# Patient Record
Sex: Male | Born: 1985
Health system: Southern US, Community
[De-identification: ages and names within clinical notes are randomized; demographics above are authoritative.]

## PROBLEM LIST (undated history)

## (undated) DIAGNOSIS — Z789 Other specified health status: Secondary | ICD-10-CM

## (undated) HISTORY — DX: Other specified health status: Z78.9

## (undated) HISTORY — PX: OTHER SURGICAL HISTORY: SHX169

---

## 2012-05-02 ENCOUNTER — Emergency Department (HOSPITAL_COMMUNITY)
Admission: EM | Admit: 2012-05-02 | Discharge: 2012-05-02 | Disposition: A | Discharge status: Home / Self Care | Payer: BC Managed Care – PPO | Attending: Emergency Medicine | Admitting: Emergency Medicine

## 2012-05-02 ENCOUNTER — Encounter (HOSPITAL_COMMUNITY): Payer: Self-pay | Admitting: *Deleted

## 2012-05-02 DIAGNOSIS — L989 Disorder of the skin and subcutaneous tissue, unspecified: Secondary | ICD-10-CM | POA: Insufficient documentation

## 2012-05-02 NOTE — ED Provider Notes (Signed)
History     CSN: [id number]  Arrival date & time 05/02/12  2122   First MD Initiated Contact with Patient 05/02/12 2224      Chief Complaint  Patient presents with  . SEXUALLY TRANSMITTED DISEASE    (Consider location/radiation/quality/duration/timing/severity/associated sxs/prior treatment) HPI Comments: Patient presents with complaints of two localized red areas on his penis. He has not had any dysuria, urethral discharge, or pain. He first noticed the spots today. He states that he was last tested for STDs 8 months ago and has been in a monogamous relationship since that time. Last sexual intercourse was 2 days ago. He do not suspect that his partner has an STD. No treatments prior to arrival. Onset acute. Course is constant. Nothing makes the symptoms better or worse.  The history is provided by the patient.    History reviewed. No pertinent past medical history.  History reviewed. No pertinent past surgical history.  History reviewed. No pertinent family history.  History  Substance Use Topics  . Smoking status: Not on file  . Smokeless tobacco: Not on file  . Alcohol Use: Yes      Review of Systems  Constitutional: Negative for fever.  HENT: Negative for sore throat.   Eyes: Negative for discharge.  Gastrointestinal: Negative for rectal pain.  Genitourinary: Positive for genital sores. Negative for dysuria, frequency, discharge, penile pain and testicular pain.  Musculoskeletal: Negative for arthralgias.  Skin: Negative for rash.  Hematological: Negative for adenopathy.    Allergies  Ceclor  Home Medications   Current Outpatient Rx  Name  Route  Sig  Dispense  Refill  . ADULT MULTIVITAMIN W/MINERALS CH   Oral   Take 1 tablet by mouth daily.           BP 148/83  Pulse 61  Temp 98.2 F (36.8 C) (Oral)  Resp 18  Ht 5\' 9"  (1.753 m)  Wt 150 lb (68.04 kg)  BMI 22.15 kg/m2  SpO2 100%  Physical Exam  Nursing note and vitals  reviewed. Constitutional: He appears well-developed and well-nourished.  HENT:  Head: Normocephalic and atraumatic.  Eyes: Conjunctivae normal are normal.  Neck: Normal range of motion. Neck supple.  Pulmonary/Chest: No respiratory distress.  Genitourinary: Right testis shows no mass and no tenderness. Left testis shows no mass and no tenderness. No penile tenderness. No discharge found.       No discharge. Two small 2-27mm macular lesions on penile shaft. Appearance non-specific. No drainage or crusting.   Lymphadenopathy:       Right: No inguinal adenopathy present.       Left: No inguinal adenopathy present.  Neurological: He is alert.  Skin: Skin is warm and dry.  Psychiatric: He has a normal mood and affect.    ED Course  Procedures (including critical care time)  Labs Reviewed - No data to display No results found.   1. Skin lesion    10:59 PM Patient seen and examined. Patient reassured. He defers testing for GC/chlamydia. Counseled on watchful waiting, to seek follow-up with worsening pain, redness, drainage, or other symptoms.  Vital signs reviewed and are as follows: Filed Vitals:   05/02/12 2204  BP: 148/83  Pulse: 61  Temp: 98.2 F (36.8 C)  Resp: 18       MDM  Two small nondescript macules on penile shaft. Do not appear herpetic and do not appear as syphilis. No penile d/c. Patient is reliable and is low risk for STD. Watchful watching for  resolution is appropriate.          [place], [place] 05/04/12 [phone]

## 2012-05-02 NOTE — ED Notes (Signed)
Pt reports rash to penis and would like to be checked for STD's for "peace of mind" denies discharge, reports some irritation.

## 2012-05-04 NOTE — ED Provider Notes (Signed)
Medical screening examination/treatment/procedure(s) were performed by non-physician practitioner and as supervising physician I was immediately available for consultation/collaboration.  Ostin Mathey, MD 05/04/12 1409 

## 2014-08-08 ENCOUNTER — Encounter (HOSPITAL_COMMUNITY): Payer: Self-pay

## 2014-08-08 ENCOUNTER — Emergency Department (HOSPITAL_COMMUNITY)
Admission: EM | Admit: 2014-08-08 | Discharge: 2014-08-09 | Disposition: A | Discharge status: Home / Self Care | Payer: BLUE CROSS/BLUE SHIELD | Attending: Emergency Medicine | Admitting: Emergency Medicine

## 2014-08-08 DIAGNOSIS — Y9389 Activity, other specified: Secondary | ICD-10-CM | POA: Diagnosis not present

## 2014-08-08 DIAGNOSIS — S40862A Insect bite (nonvenomous) of left upper arm, initial encounter: Secondary | ICD-10-CM | POA: Insufficient documentation

## 2014-08-08 DIAGNOSIS — W57XXXA Bitten or stung by nonvenomous insect and other nonvenomous arthropods, initial encounter: Secondary | ICD-10-CM | POA: Insufficient documentation

## 2014-08-08 DIAGNOSIS — Z79899 Other long term (current) drug therapy: Secondary | ICD-10-CM | POA: Insufficient documentation

## 2014-08-08 DIAGNOSIS — R05 Cough: Secondary | ICD-10-CM | POA: Insufficient documentation

## 2014-08-08 DIAGNOSIS — Y998 Other external cause status: Secondary | ICD-10-CM | POA: Diagnosis not present

## 2014-08-08 DIAGNOSIS — S4992XA Unspecified injury of left shoulder and upper arm, initial encounter: Secondary | ICD-10-CM | POA: Diagnosis present

## 2014-08-08 DIAGNOSIS — R091 Pleurisy: Secondary | ICD-10-CM

## 2014-08-08 DIAGNOSIS — Y9259 Other trade areas as the place of occurrence of the external cause: Secondary | ICD-10-CM | POA: Insufficient documentation

## 2014-08-08 NOTE — ED Notes (Signed)
Pt had a sore throat last week, viral, he was just in a hotel this week, about 4pm tonight he noticed red bumps on his left arm and states that his arm is achy

## 2014-08-08 NOTE — ED Provider Notes (Signed)
CSN: [id number]     Arrival date & time 08/08/14  2253 History  This chart was scribed for non-physician practitioner, Emilia BeckKaitlyn Sherle Mello, PA-C, working with Azalia BilisKevin Campos, MD, by Ronney LionSuzanne Le, ED Scribe. This patient was seen in room WTR6/WTR6 and the patient's care was started at 11:57 PM.     Chief Complaint  Patient presents with  . Arm Pain   The history is provided by the patient. No language interpreter was used.    HPI Comments: Jeffery Le is a 29 y.o. male who is right hand dominant who presents to the Emergency Department complaining of dull, aching, intermittently shooting, sharp left arm pain that began yesterday.  Today, he noticed red marks on his right arm. Patient states he had a sore throat last week, and states he still has an intermittent cough likely from the infection, though it feels mostly resolved, as well as associated chest pain. He called a nurse's line and described his symptoms, and was told to come to the ED. Deep inspiration exacerbates the arm pain. He was [place] in [place] in [place], but states he didn't notice any insects. He hasn't taken any medications for this. He denies leg swelling or itching. He also denies any recent heavy lifting.   History reviewed. No pertinent past medical history. History reviewed. No pertinent past surgical history. History reviewed. No pertinent family history. History  Substance Use Topics  . Smoking status: Never Smoker   . Smokeless tobacco: Not on file  . Alcohol Use: Yes    Review of Systems  Respiratory: Positive for cough.   Cardiovascular: Positive for chest pain (secondary to cough). Negative for leg swelling.  Musculoskeletal: Positive for myalgias (arm pain).  Skin: Positive for rash (red bumps on left arm).  All other systems reviewed and are negative.   Allergies  Ceclor  Home Medications   Prior to Admission medications   Medication Sig Start Date End Date Taking? Authorizing Provider   Multiple Vitamin (MULTIVITAMIN WITH MINERALS) TABS Take 1 tablet by mouth daily.    Historical Provider, MD   BP 142/85 mmHg  Pulse 70  Temp(Src) 97.7 F (36.5 C) (Oral)  Resp 18  Ht 5\' 9"  (1.753 m)  Wt 155 lb (70.308 kg)  BMI 22.88 kg/m2  SpO2 98% Physical Exam  Constitutional: He appears well-developed and well-nourished.  HENT:  Head: Normocephalic and atraumatic.  Eyes: Conjunctivae are normal. Pupils are equal, round, and reactive to light.  Neck: Neck supple. No tracheal deviation present. No thyromegaly present.  Cardiovascular: Normal rate and regular rhythm.   No murmur heard. Pulmonary/Chest: Effort normal and breath sounds normal.  Lungs are clear to auscultation.   Abdominal: Soft. Bowel sounds are normal. He exhibits no distension. There is no tenderness.  Musculoskeletal: Normal range of motion. He exhibits no edema or tenderness.  Neurological: He is alert. Coordination normal.  Skin: Skin is warm and dry. No rash noted.  Scattered erythematous papules on left arm and in finger webs.   Psychiatric: He has a normal mood and affect.  Nursing note and vitals reviewed.   ED Course  Procedures (including critical care time)  DIAGNOSTIC STUDIES: Oxygen Saturation is 98% on room air, normal by my interpretation.    COORDINATION OF CARE: 12:00 AM - Discussed treatment plan with pt at bedside which includes EKG, and pt agreed to plan. He declines any Tylenol at this time.   Labs Review Labs Reviewed - No data to display  Imaging Review No results found.  <ECG> .ecg   EKG Interpretation None      MDM   Final diagnoses:  None   1:19 AM Patient's EKG unremarkable for acute changes. Patient likely has pleurisy from previous viral illness. Patient will be treated for bed bugs with permethrin.   I personally performed the services described in this documentation, which was scribed in my presence. The recorded information has been reviewed and is  accurate.     Emilia Beck, PA-C 08/09/14 0121  Azalia Bilis, MD 08/09/14 [phone]

## 2014-08-09 MED ORDER — PERMETHRIN 5 % EX CREA: TOPICAL_CREAM | CUTANEOUS | Status: DC

## 2014-08-09 NOTE — ED Notes (Signed)
EKG GIVEN TO EDP CAMPOS FOR REVIEW

## 2014-08-09 NOTE — Discharge Instructions (Signed)
Use permethrin cream as directed. Refer to attached documents for more information.  °

## 2014-08-09 NOTE — ED Notes (Signed)
Pt reports L arm pain and rash since Friday.  Reports recently having a sore throat and body aches.

## 2014-08-11 ENCOUNTER — Encounter (HOSPITAL_COMMUNITY): Payer: Self-pay | Admitting: Emergency Medicine

## 2014-08-11 ENCOUNTER — Emergency Department (HOSPITAL_COMMUNITY)
Admission: EM | Admit: 2014-08-11 | Discharge: 2014-08-11 | Disposition: A | Discharge status: Home / Self Care | Payer: BLUE CROSS/BLUE SHIELD | Attending: Emergency Medicine | Admitting: Emergency Medicine

## 2014-08-11 DIAGNOSIS — R21 Rash and other nonspecific skin eruption: Secondary | ICD-10-CM | POA: Diagnosis present

## 2014-08-11 DIAGNOSIS — B029 Zoster without complications: Secondary | ICD-10-CM | POA: Insufficient documentation

## 2014-08-11 MED ORDER — ACYCLOVIR 200 MG PO CAPS
800.0000 mg | ORAL_CAPSULE | Freq: Once | ORAL | Status: AC
  Administered 2014-08-11: 800 mg via ORAL
  Filled 2014-08-11: qty 4

## 2014-08-11 MED ORDER — ACYCLOVIR 200 MG PO CAPS
800.0000 mg | ORAL_CAPSULE | Freq: Every day | ORAL | Status: AC
Start: 2014-08-11 — End: 2014-08-18

## 2014-08-11 NOTE — Discharge Instructions (Signed)
Shingles Shingles is caused by the same virus that causes chickenpox. The first feelings may be pain or tingling. A rash will follow in a couple days. The rash may occur on any area of the body. Long-lasting pain is more likely in an elderly person. It can last months to years. There are medicines that can help prevent pain if you start taking them early. HOME CARE   Take cool baths or place cool cloths on the rash as told by your doctor.  Take medicine only as told by your doctor.  Rest as told by your doctor.  Keep your rash clean with mild soap and cool water or as told by your doctor.  Do not scratch your rash. You may use calamine lotion to relieve itchy skin as told by your doctor.  Keep your rash covered with a loose bandage (dressing).  Avoid touching:  Babies.  Pregnant women.  Children with inflamed skin (eczema).  People who have gotten organ transplants.  People with chronic illnesses, such as leukemia or AIDS.  Wear loose-fitting clothing.  If the rash is on the face, you may need to see a specialist. Keep all appointments. Shingles must be kept away from the eyes, if possible.  Keep all follow-up visits as told by your doctor. GET HELP RIGHT AWAY IF:   You have any pain on the face or eye.  You lose feeling on one side of your face.  You have ear pain or ringing in your ear.  You cannot taste as well.  Your medicines do not help the pain.  Your redness or puffiness (swelling) spreads.  You feel like you are getting worse.  You have a fever. MAKE SURE YOU:   Understand these instructions.  Will watch your condition.  Will get help right away if you are not doing well or get worse. Document Released: 10/27/2007 Document Revised: 09/24/2013 Document Reviewed: 10/27/2007 [employer] [employer] Patient Information 2015 [employer] [place]. This information is not intended to replace advice given to you by your health care provider. Make sure you discuss any questions  you have with your health care provider. Make sure to take the acyclovir as directed until all tablets have been completed

## 2014-08-11 NOTE — ED Notes (Signed)
Awaiting arrival of med from main pharmacy

## 2014-08-11 NOTE — ED Notes (Signed)
Pt states he was seen here on Thursday for rash they thought was bedbugs and was given a cream  Pt states the rash continues and wants to be rechecked

## 2014-08-11 NOTE — ED Provider Notes (Signed)
CSN: [id number]     Arrival date & time 08/11/14  1941 History   First MD Initiated Contact with Patient 08/11/14 2000     This chart was scribed for non-physician practitioner, Earley FavorGail King Pinzon, FNP working with Cathren LaineKevin Steinl, MD by Arlan OrganAshley Leger, ED Scribe. This patient was seen in room WTR5/WTR5 and the patient's care was started at 8:35 PM.   Chief Complaint  Patient presents with  . Rash   The history is provided by the patient. No language interpreter was used.    HPI Comments: Jeffery Le is a 29 y.o. male without any pertinent past medical history who presents to the Emergency Department complaining of a constant, painful, ongoing, rash to the L arm x 3 days. Discomfort to rash is described as burning. He also reports "burning" sensation to back of L shoulder without any noticeable rash at this time. 1.5 weeks ago pt reports sore throat and body aches. He was seen at Urgent Care and was discharged home without any medications. Pt states symptoms resolved and rash to L arm appeared shortly after. [name] was seen 08/08/14 for same as he suspected bedbug exposure. At time of visit, pt was discharged with Permethrin. Pt with known allergy to Ceclor.  History reviewed. No pertinent past medical history. History reviewed. No pertinent past surgical history. Family History  Problem Relation Age of Onset  . Diabetes Other    History  Substance Use Topics  . Smoking status: Never Smoker   . Smokeless tobacco: Not on file  . Alcohol Use: Yes    Review of Systems  Constitutional: Negative for fever and chills.  Skin: Positive for rash.      Allergies  Ceclor  Home Medications   Prior to Admission medications   Medication Sig Start Date End Date Taking? Authorizing Provider  Multiple Vitamin (MULTIVITAMIN WITH MINERALS) TABS Take 1 tablet by mouth daily.    Historical Provider, MD  permethrin (ELIMITE) 5 % cream Apply to affected area once 08/09/14   Emilia BeckKaitlyn Szekalski, PA-C    Triage Vitals: BP 148/81 mmHg  Pulse 76  Temp(Src) 98.8 F (37.1 C) (Oral)  Resp 16  SpO2 98%   Physical Exam  Constitutional: He is oriented to person, place, and time. He appears well-developed and well-nourished.  HENT:  Head: Normocephalic.  Eyes: EOM are normal.  Neck: Normal range of motion.  Pulmonary/Chest: Effort normal.  Musculoskeletal: Normal range of motion.  Neurological: He is alert and oriented to person, place, and time.  Skin: Rash noted.  Patient has a vesicular rash on his left arm including forearm inner upper arm and axilla  Psychiatric: He has a normal mood and affect.  Nursing note and vitals reviewed.   ED Course  Procedures (including critical care time)  DIAGNOSTIC STUDIES: Oxygen Saturation is 98% on RA, Normal by my interpretation.    COORDINATION OF CARE: 8:39 PM-Discussed treatment plan with pt at bedside and pt agreed to plan.     Labs Review Labs Reviewed - No data to display  Imaging Review No results found.   EKG Interpretation None     patient will be started on acyclovir 800 mg 5 times a day for 1 week.  He's been given information on shingles given precautions as far as contact  MDM   Final diagnoses:  None    I personally performed the services described in this documentation, which was scribed in my presence. The recorded information has been reviewed and is accurate.  Earley Favor, NP 08/11/14 2046  Earley Favor, NP 08/11/14 1308  Cathren Laine, MD 08/13/14 [phone]

## 2016-01-22 DIAGNOSIS — D2272 Melanocytic nevi of left lower limb, including hip: Secondary | ICD-10-CM | POA: Diagnosis not present

## 2016-04-07 DIAGNOSIS — Z0000 Encounter for general adult medical examination without abnormal findings: Secondary | ICD-10-CM | POA: Diagnosis not present

## 2016-04-12 DIAGNOSIS — Z0000 Encounter for general adult medical examination without abnormal findings: Secondary | ICD-10-CM | POA: Diagnosis not present

## 2016-07-13 DIAGNOSIS — H02826 Cysts of left eye, unspecified eyelid: Secondary | ICD-10-CM | POA: Diagnosis not present

## 2016-12-28 ENCOUNTER — Ambulatory Visit (INDEPENDENT_AMBULATORY_CARE_PROVIDER_SITE_OTHER): Payer: BLUE CROSS/BLUE SHIELD | Admitting: Sports Medicine

## 2016-12-28 ENCOUNTER — Encounter: Payer: Self-pay | Admitting: Sports Medicine

## 2016-12-28 VITALS — BP 116/70 | HR 60 | Ht 69.0 in | Wt 152.2 lb

## 2016-12-28 DIAGNOSIS — M2140 Flat foot [pes planus] (acquired), unspecified foot: Secondary | ICD-10-CM | POA: Diagnosis not present

## 2016-12-28 DIAGNOSIS — M25561 Pain in right knee: Secondary | ICD-10-CM | POA: Diagnosis not present

## 2016-12-28 DIAGNOSIS — R269 Unspecified abnormalities of gait and mobility: Secondary | ICD-10-CM | POA: Diagnosis not present

## 2016-12-28 DIAGNOSIS — M76829 Posterior tibial tendinitis, unspecified leg: Secondary | ICD-10-CM

## 2016-12-28 NOTE — Patient Instructions (Addendum)
Schedule an appointment for Orthotics.   Also check out the YouTube Video from Dr. Myles LippsEric Goodman.  I would like to see you try performing this 5-6 days per week.    A good intro video is: "Independence from Pain 7-minute Video" - [email or web address]/[email or web address]   His more advanced video is: "Powerful Posture and Pain Relief: 12 minutes of Foundation Training" - https://youtu.be/[email or web address]   Do not try to attempt this entire video when first beginning.    Try breaking of each exercise that he goes into shorter segments.  Otherwise if they perform an exercise for 45 seconds, start with 15 seconds and rest and then resume with a begin the new activity.  Work your way up to doing this 12 minute video and I expect to see significant improvements in your pain.

## 2016-12-28 NOTE — Progress Notes (Signed)
OFFICE VISIT NOTE Jeffery FellsMichael D. Delorise Shinerigby, DO     [facility] Phys[facility]  [facility] [place] [phone]  Jeffery Le - 31 y.o. male MRN [id number]  Date of birth: [birth year]  Visit Date: 12/28/2016  PCP: Jeffery DarkParker, Caleb M, MD   Referred by: No ref. provider found  Jeffery Le, CMA acting as scribe for Dr. Berline Choughigby.  SUBJECTIVE:   Chief Complaint  Patient presents with  . Follow-up    right knee pain   HPI: As below and per problem based documentation when appropriate.  Jeffery Le presents today as a new patient with complaint of right knee pain.  Knee pain started about 2 years ago. He was seen in the past and had xray and MRI of the knee. [facility]. They told him that they couldn't find anything wrong after reviewing the imaging. He was given steroid injection and PT was recommended but he did not proceed with it. He was moving into a new apartment about 2 years ago and does recall feeling a sharp pain after twisting his knee wrong. He has also played soccer, lacrosse, and basketball throughout high school and college. He still does weight lifting and running/cardio. No recent injury to the knee.  Pain is worse when running, changing direction, going down stairs or down a hill. He also has increased pain when sitting for prolonged periods of time. Pain is medial and infrapatellar. He has noticed some swelling in the knee. He reports little relief with steroid injection. He will apply ice to the knee occasionally and he has taken Advil in the past for the pain. He got some short term relief with these therapies. Pain is described as throbbing with occasional sharp pain when running. Sharp pain is rated about 5/10 and throbbing pain is rated about 1/10.  Pt denies radiation of pain into upper or lower leg, no ankle pain or hip pain.     Review of Systems  Constitutional: Negative for chills  and fever.  Respiratory: Negative for shortness of breath and wheezing.   Cardiovascular: Negative for chest pain, palpitations and leg swelling.  Musculoskeletal: Positive for joint pain.  Neurological: Negative for dizziness, tingling and headaches.  Endo/Heme/Allergies: Does not bruise/bleed easily.    Otherwise per HPI.  HISTORY & PERTINENT PRIOR DATA:  No specialty comments available. He reports that he quit smoking about 10 years ago. His smokeless tobacco use includes Chew. No results for input(s): HGBA1C, LABURIC in the last 8760 hours. Medications & Allergies reviewed per EMR Patient Active Problem List   Diagnosis Date Noted  . Right knee pain 12/28/2016  . Gait disturbance 12/28/2016   No past medical history on file. Family History  Problem Relation Age of Onset  . Diabetes Other        Paternal side  . Alzheimer's disease Other        Maternal side  . Hyperlipidemia Father    Past Surgical History:  Procedure Laterality Date  . ear tues Bilateral    Social History   Occupational History  . pilot    Social History Main Topics  . Smoking status: Former Smoker    Quit date: 2008  . Smokeless tobacco: Current User    Types: Chew  . Alcohol use Yes  . Drug use: No  . Sexual activity: Yes    Partners: Female    OBJECTIVE:  VS:  HT:5\' 9"  (  175.3 cm)   WT:152 lb 3.2 oz (69 kg)  BMI:22.5    BP:116/70  HR:60bpm  TEMP: ( )  RESP:97 % EXAM: Findings:  WDWN, NAD, Non-toxic appearing Alert & appropriately interactive Not depressed or anxious appearing No increased work of breathing. Pupils are equal. EOM intact without nystagmus No clubbing or cyanosis of the extremities appreciated No significant rashes/lesions/ulcerations overlying the examined area. DP & PT pulses 2+/4.  No significant pretibial edema.  No clubbing or cyanosis Sensation intact to light touch in lower extremities.  Right Knee: Overall joint is well aligned, no significant deformity.     No significant effusion.   ROM: 0 to 120.   Extensor mechanism intact No significant medial or lateral joint line tenderness.   Stable to varus/valgus strain & anterior/posterior drawer.  Normal Lachman's.   Negative McMurray's and Thessaly structural exam:  Structural exam: Right externally rotated lower leg.  He has posterior tibialis insufficiency type I on the right, normal on the left with slight longitudinal arch collapse.  He has a externally rotated right foot with ambulation.  Right anterior innominate with significantly tight hip flexor on the right and tight hamstrings.  He has good internal/external rotation of the hip.  There is slight medial column collapse on the right compared to the left.  The transverse arch has splay toe between toes 1 and 2 bilaterally; worse on the right than the left     No results found. ASSESSMENT & PLAN:     ICD-10-CM   1. Right knee pain, unspecified chronicity M25.561   2. Posterior tibialis tendon insufficiency M21.40   3. 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  Scientist, research (medical)" exercises provided today.  We also discussed the importance of avoiding deep knee squatting.  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  We will plan to have him come back to have these fabricated and have him begin on therapeutic exercises in the interim.   =================================================================  Follow-up: Return in about 1 week (around 01/04/2017) for for Orthotics. Okay to schedule same day as PCP appointment.   CMA/ATC served as Neurosurgeon during this visit. History, Physical, and Plan performed by medical  provider. Documentation and orders reviewed and attested to.      Gaspar Bidding, DO    [facility] Phys[facility]

## 2017-01-03 ENCOUNTER — Ambulatory Visit (INDEPENDENT_AMBULATORY_CARE_PROVIDER_SITE_OTHER): Payer: BLUE CROSS/BLUE SHIELD | Admitting: Sports Medicine

## 2017-01-03 ENCOUNTER — Ambulatory Visit (INDEPENDENT_AMBULATORY_CARE_PROVIDER_SITE_OTHER): Payer: BLUE CROSS/BLUE SHIELD | Admitting: Family Medicine

## 2017-01-03 ENCOUNTER — Encounter: Payer: Self-pay | Admitting: Family Medicine

## 2017-01-03 ENCOUNTER — Encounter: Payer: Self-pay | Admitting: Sports Medicine

## 2017-01-03 VITALS — BP 100/80 | HR 66 | Ht 69.0 in | Wt 150.6 lb

## 2017-01-03 DIAGNOSIS — Z8342 Family history of familial hypercholesterolemia: Secondary | ICD-10-CM | POA: Diagnosis not present

## 2017-01-03 DIAGNOSIS — R269 Unspecified abnormalities of gait and mobility: Secondary | ICD-10-CM

## 2017-01-03 DIAGNOSIS — G8929 Other chronic pain: Secondary | ICD-10-CM

## 2017-01-03 DIAGNOSIS — M25561 Pain in right knee: Secondary | ICD-10-CM

## 2017-01-03 DIAGNOSIS — Z0000 Encounter for general adult medical examination without abnormal findings: Secondary | ICD-10-CM | POA: Diagnosis not present

## 2017-01-03 DIAGNOSIS — Z114 Encounter for screening for human immunodeficiency virus [HIV]: Secondary | ICD-10-CM | POA: Diagnosis not present

## 2017-01-03 LAB — CBC WITH DIFFERENTIAL/PLATELET
Basophils Absolute: 0 10*3/uL (ref 0.0–0.1)
Basophils Relative: 0.7 % (ref 0.0–3.0)
EOS PCT: 2.5 % (ref 0.0–5.0)
Eosinophils Absolute: 0.1 10*3/uL (ref 0.0–0.7)
HEMATOCRIT: 42.3 % (ref 39.0–52.0)
HEMOGLOBIN: 14.4 g/dL (ref 13.0–17.0)
Lymphocytes Relative: 42 % (ref 12.0–46.0)
Lymphs Abs: 2.2 10*3/uL (ref 0.7–4.0)
MCHC: 34 g/dL (ref 30.0–36.0)
MCV: 95.4 fl (ref 78.0–100.0)
MONO ABS: 0.4 10*3/uL (ref 0.1–1.0)
Monocytes Relative: 7.3 % (ref 3.0–12.0)
Neutro Abs: 2.5 10*3/uL (ref 1.4–7.7)
Neutrophils Relative %: 47.5 % (ref 43.0–77.0)
PLATELETS: 262 10*3/uL (ref 150.0–400.0)
RBC: 4.43 Mil/uL (ref 4.22–5.81)
RDW: 12.2 % (ref 11.5–15.5)
WBC: 5.2 10*3/uL (ref 4.0–10.5)

## 2017-01-03 LAB — COMPREHENSIVE METABOLIC PANEL
ALBUMIN: 4.8 g/dL (ref 3.5–5.2)
ALK PHOS: 49 U/L (ref 39–117)
ALT: 18 U/L (ref 0–53)
AST: 25 U/L (ref 0–37)
BILIRUBIN TOTAL: 0.9 mg/dL (ref 0.2–1.2)
BUN: 23 mg/dL (ref 6–23)
CO2: 32 mEq/L (ref 19–32)
Calcium: 9.7 mg/dL (ref 8.4–10.5)
Chloride: 99 mEq/L (ref 96–112)
Creatinine, Ser: 0.95 mg/dL (ref 0.40–1.50)
GFR: 98.4 mL/min (ref >60.00)
Glucose, Bld: 86 mg/dL (ref 70–99)
POTASSIUM: 4.4 meq/L (ref 3.5–5.1)
Sodium: 136 mEq/L (ref 135–145)
TOTAL PROTEIN: 6.7 g/dL (ref 6.0–8.3)

## 2017-01-03 LAB — LIPID PANEL
Cholesterol: 125 mg/dL (ref 0–200)
HDL: 53.6 mg/dL (ref >39.00)
LDL CALC: 58 mg/dL (ref 0–99)
NONHDL: 71.64
TRIGLYCERIDES: 66 mg/dL (ref 0.0–149.0)
Total CHOL/HDL Ratio: 2
VLDL: 13.2 mg/dL (ref 0.0–40.0)

## 2017-01-03 NOTE — Progress Notes (Signed)
Subjective:  Jeffery Le is a 31 y.o. male who presents today for his annual comprehensive physical exam.    HPI:  Lump in Nose It has been there for a little over a year and has gotten smaller over that time. Feels in in his right nostril. It is otherwise asymptomatic and does not bother him. No pain or bleeding. No fevers or chills. No weight loss. No treatments tried. Occasionally gets seasonal allergies but does not take any medications.   Depression screen PHQ 2/9 01/03/2017  Decreased Interest 0  Down, Depressed, Hopeless 0  PHQ - 2 Score 0    Health Maintenance Due  Topic Date Due  . HIV Screening  03/10/2001  . TETANUS/TDAP  03/10/2005  . INFLUENZA VACCINE  12/22/2016    ROS: Per HPI, otherwise all systems reviewed and are negative  PMH:  The following were reviewed and entered/updated in epic: History reviewed. No pertinent past medical history. Patient Active Problem List   Diagnosis Date Noted  . Right knee pain 12/28/2016  . Gait disturbance 12/28/2016   Past Surgical History:  Procedure Laterality Date  . ear tues Bilateral    Family History  Problem Relation Age of Onset  . Diabetes Other        Paternal side  . Alzheimer's disease Other        Maternal side  . Hyperlipidemia Father    Medications- reviewed and updated Current Outpatient Prescriptions  Medication Sig Dispense Refill  . ibuprofen (ADVIL,MOTRIN) 200 MG tablet Take 200 mg by mouth every 6 (six) hours as needed.    . Multiple Vitamin (MULTIVITAMIN WITH MINERALS) TABS Take 1 tablet by mouth daily.     No current facility-administered medications for this visit.     Allergies-reviewed and updated Allergies  Allergen Reactions  . Ceclor [Cefaclor]     Unknown   . Cephalosporins Other (See Comments)    unknown    Social History   Social History  . Marital status: Single    Spouse name: N/A  . Number of children: 0  . Years of education: N/A   Occupational  History  . pilot    Social History Main Topics  . Smoking status: Former Smoker    Quit date: 2008  . Smokeless tobacco: Current User    Types: Chew  . Alcohol use Yes  . Drug use: No  . Sexual activity: Yes    Partners: Female   Other Topics Concern  . None   Social History Narrative  . None   Objective:  Physical Exam: BP 100/80   Pulse 66   Ht 5\' 9"  (1.753 m)   Wt 150 lb 9.6 oz (68.3 kg)   SpO2 98%   BMI 22.24 kg/m   Body mass index is 22.24 kg/m. Gen: NAD, resting comfortably HEENT: Small nasal polyp noted in right nostril. Otherwise normal mucosal tissue.  CV: RRR with no murmurs appreciated Pulm: NWOB, CTAB with no crackles, wheezes, or rhonchi GI: Normal bowel sounds present. Soft, Nontender, Nondistended. MSK: no edema, cyanosis, or clubbing noted Skin: warm, dry Neuro: grossly normal, moves all extremities Psych: Normal affect and thought content  Assessment/Plan:   Nasal Polyp Given that it has decreased in size and has been present for over a year, very low likelihood of it being non-benign. Likely a mucosal polyp given his history of allergic rhinitis. Recommended over the counter intranasal glucocorticoids, however patient deferred. Reassured patient. Encouraged patient to continue watching it and  let us know if it starts to increase in size or become symptomatic.   Healthcare Maintenance Check lipid panel (given family history) and HIV antibody today.   Patient Counseling:  -Nutrition: Stressed importance of moderation in sodium/caffeine intake, saturated fat and cholesterol, caloric balance, sufficient intake of fresh fruits, vegetables, and fiber.  -Stressed the importance of regular exercise.   -Substance Abuse: Discussed cessation/primary prevention of tobacco, alcohol, or other drug use; driving or other dangerous activities under the influence; availability of treatment for abuse.   -Injury prevention: Discussed safety belts, safety helmets,  smoke detector, smoking near bedding or upholstery.   -Sexuality: Discussed sexually transmitted diseases, partner selection, use of condoms, avoidance of unintended pregnancy and contraceptive alternatives.   -Health maintenance and immunizations reviewed. Please refer to Health maintenance section.  Return to care in 1 year for next preventative visit.   Jeffery Degreealeb M. Jimmey RalphParker, MD 01/03/2017 9:32 AM

## 2017-01-03 NOTE — Progress Notes (Signed)
  Jeffery Le - 31 y.o. male MRN [id number]  Date of birth: February 22, 1986  SUBJECTIVE: CC: Office visit for Custom Orthotic Fabrication HPI: Patient presents for custom cushion orthotic fabrication given history as outlined per prior office visit note.  He is an Buyer, retailairline pilot and has been having medial knee pain secondary to excessive pronation with gait.  ROS: Per HPI  HISTORY:  Past Medical, Surgical, Social, and Family History reviewed & updated per EMR.  Pertinent Historical Findings include:  reports that he quit smoking about 10 years ago. His smokeless tobacco use includes Chew.  OBJECTIVE:  VS:   HT:5\' 9"  (175.3 cm)   WT:150 lb 9.6 oz (68.3 kg)  BMI:22.3          BP:100/80  HR:66bpm  TEMP: ( )  RESP:98 %  PHYSICAL EXAM:  Adult male in no acute distress alert and appropriate. Bilateral lower extremities overall well aligned.  He has excessive pronation while at rest with calcaneal valgus of approximately 15.  ASSESSMENT: 1. Gait disturbance   2. Chronic pain of right knee    PROCEDURES: CUSTOM ORTHOTIC FABRICATION The patient was fitted for a standard, cushioned, semi-rigid orthotic. The orthotic was heated & placed on the orthotic stand. The patient was positioned in subtalar neutral position and 10 of ankle dorsiflexion and weight bearing stance some heated orthotic blank. After completion of the molding a stable paste was applied to the orthotic blank. The orthotic was ground to a stable position for weightbearing. The patient ambulated in these and reported they were comfortable without pressure spots.              BLANK:  Size 11 - Standard Cushioned                 BASE:  Blue EVA      POSTINGS:  n/a  PLAN: See problem based charting & AVS for additional documentation.  Custom Orthotics As above  HEP: Continue as outlined

## 2017-01-03 NOTE — Patient Instructions (Signed)
We will check blood work.  Continue being active and eating a healthy diet.  Come back in 1 year,  Take care,  Dr Jimmey RalphParker

## 2017-01-04 LAB — HIV ANTIBODY (ROUTINE TESTING W REFLEX): HIV 1&2 Ab, 4th Generation: NONREACTIVE

## 2017-01-07 ENCOUNTER — Telehealth: Payer: Self-pay | Admitting: Family Medicine

## 2017-01-07 NOTE — Telephone Encounter (Signed)
ROI fax to [facility]

## 2017-01-15 NOTE — Assessment & Plan Note (Signed)
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  Scientist, research (medical)" exercises provided today.  We also discussed the importance of avoiding deep knee squatting.  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  We will plan to have him come back to have these fabricated and have him begin on therapeutic exercises in the interim.

## 2017-07-15 DIAGNOSIS — F4322 Adjustment disorder with anxiety: Secondary | ICD-10-CM | POA: Diagnosis not present

## 2017-08-09 DIAGNOSIS — F4322 Adjustment disorder with anxiety: Secondary | ICD-10-CM | POA: Diagnosis not present

## 2017-08-15 ENCOUNTER — Ambulatory Visit (INDEPENDENT_AMBULATORY_CARE_PROVIDER_SITE_OTHER): Payer: BLUE CROSS/BLUE SHIELD | Admitting: Family Medicine

## 2017-08-15 ENCOUNTER — Encounter: Payer: Self-pay | Admitting: Family Medicine

## 2017-08-15 VITALS — BP 116/72 | HR 75 | Temp 97.9°F | Ht 69.0 in | Wt 162.0 lb

## 2017-08-15 DIAGNOSIS — R05 Cough: Secondary | ICD-10-CM | POA: Diagnosis not present

## 2017-08-15 DIAGNOSIS — R058 Other specified cough: Secondary | ICD-10-CM

## 2017-08-15 NOTE — Progress Notes (Signed)
    Subjective:  Jeffery Le is a 32 y.o. male who presents today for same-day appointment with a chief complaint of cough.   HPI:  Cough, Acute Issue Started 2 weeks ago. Initially associated with rhinorrhea, postnasal drip, and ear pressure.  The symptoms have since resolved however he has had a persistent cough.  No shortness of breath.  No fever or chills.  Has not tried any treatments for this.  Patient had similar episode 2 years ago and wanted to make sure there was nothing else going on.  ROS: Per HPI  PMH: He reports that he quit smoking about 11 years ago. His smokeless tobacco use includes chew. He reports that he drinks alcohol. He reports that he does not use drugs.  Objective:  Physical Exam: BP 116/72 (BP Location: Left Arm)   Pulse 75   Temp 97.9 F (36.6 C)   Ht 5\' 9"  (1.753 m)   Wt 162 lb (73.5 kg)   SpO2 (!) 77%   BMI 23.92 kg/m   Gen: NAD, resting comfortably HEENT: TMs clear.  Oropharynx clear.  Nasal mucosa without abnormality. CV: RRR with no murmurs appreciated Pulm: NWOB, CTAB with no crackles, wheezes, or rhonchi  Assessment/Plan:  Cough Likely postviral cough syndrome.  Pulmonary exam today without abnormality.  No red flag signs or symptoms.  Reassured patient and discussed typical length of illness.  Patient deferred further pharmacotherapy at this point.  Return precautions reviewed.  Follow-up as needed.  Katina Degreealeb M. Jimmey RalphParker, MD 08/15/2017 11:00 AM

## 2017-09-23 ENCOUNTER — Encounter: Payer: Self-pay | Admitting: Family Medicine

## 2017-09-23 ENCOUNTER — Ambulatory Visit (INDEPENDENT_AMBULATORY_CARE_PROVIDER_SITE_OTHER): Payer: BLUE CROSS/BLUE SHIELD | Admitting: Family Medicine

## 2017-09-23 VITALS — BP 124/72 | HR 75 | Temp 98.1°F | Ht 69.0 in | Wt 160.8 lb

## 2017-09-23 DIAGNOSIS — M25571 Pain in right ankle and joints of right foot: Secondary | ICD-10-CM

## 2017-09-23 NOTE — Progress Notes (Signed)
    Subjective:  Jeffery Le is a 32 y.o. male who presents today for same-day appointment with a chief complaint of ankle pain.   HPI:  Ankle Pain, New problem  Started 2 weeks ago. Patient injured his right ankle while riding a scooter.  Patient had a curve and subsequently noticed pain to the medial aspect of his right ankle.  He was seen at a local urgent care and had x-rays done which were negative for fracture.  Patient was diagnosed with an ankle sprain and sent home with a prescription for naproxen and a compressive sleeve for his ankle.  Symptoms have improved over the last 1 to 2 weeks.  Still has little bit of swelling to the medial aspect of his right ankle.  Still has a small amount of pain when he pushes down with his foot.No other obvious alleviating or aggravating factors.  ROS: Per HPI  PMH: He reports that he quit smoking about 11 years ago. His smokeless tobacco use includes chew. He reports that he drinks alcohol. He reports that he does not use drugs.  Objective:  Physical Exam: BP 124/72 (BP Location: Left Arm, Patient Position: Sitting, Cuff Size: Normal)   Pulse 75   Temp 98.1 F (36.7 C) (Oral)   Ht  (1.753 m)   Wt 160 lb 12.8 oz (72.9 kg)   SpO2 97%   BMI 23.75 kg/m   Gen: NAD, resting comfortably MSK: -Right ankle: Mild effusion noted to medial malleolus.  Nontender to palpation.  Negative talar tilt.  Posterior and anterior drawer signs negative.  Neurovascular intact distally  Assessment/Plan:  Right ankle pain Seems to be healing normally.  Patient is about 2 weeks status post his injury and has had significant improvement in his symptoms.  Advised patient to continue using anti-inflammatory and compression sleeve for the next 2 to 4 weeks.  Home exercise program was also discussed with patient focusing on strengthening ankle musculature.  Patient has a fair amount of pronation bilaterally which could contribute to his medial ankle pain.  He  has already seen sports medicine for this and has orthotics in place.  Discussed reasons to return to care.  He will follow-up as needed.  [name]. [name], MD 09/23/2017 1:12 PM

## 2017-09-23 NOTE — Patient Instructions (Signed)
It is nice to see you today!  Your ankle is healing normally.  Please continue working on the exercise and using the compression sleeve.  Let me know if your symptoms worsen or do not improve over the next 2-4 weeks.  Take care, Dr [name]

## 2017-10-12 NOTE — Progress Notes (Signed)
[facility]ells. [facility] at [facility] [phone]  Raylyn Speckman Spira - 32 y.o. male MRN [id number]  Date of birth: Jan 06, 1986  Visit Date: 10/13/2017  PCP: Ardith Dark, MD   Referred by: Ardith Dark, MD  Scribe for today's visit: Stevenson Clinch, CMA     SUBJECTIVE:  Jeffery Le is here for No chief complaint on file.  His R ankle pain  symptoms INITIALLY: Began about 5 weeks ago and started after running into curb with scooter. He hit the medial aspect of the R ankle on the concrete.  Described as mild-severe burning that comes and goes, non-radiating. Worsened with plantar flexion and inversion Improved with rest. He denies pain with weight bearing.  Additional associated symptoms include: He c/o continued swelling around the ankle.    At this time symptoms are improving compared to onset. He has been taking Naproxen prn. He tried icing the ankle for a while but he hasn't been for the past week. He has been doing HEP for ankle with little trouble.   He had XR done in [place] at [facility].   He also c/o R knee pain. He has been seen for this in the past. Pain is around the patella. He has noticed some swelling around the knee. The area is tender to palpation.   ROS Denies night time disturbances. Denies fevers, chills, or night sweats. Denies unexplained weight loss. Denies personal history of cancer. Denies changes in bowel or bladder habits. Reports recent unreported falls x 1 (off scooter). Denies new or worsening dyspnea or wheezing. Denies headaches or dizziness.  Denies numbness, tingling or weakness  In the extremities.  Denies dizziness or presyncopal episodes Reports lower extremity edema    HISTORY & PERTINENT PRIOR DATA:  Prior History reviewed and updated per electronic medical record.  Significant/pertinent history, findings, studies include:  reports that he quit smoking  about 11 years ago. His smokeless tobacco use includes chew. No results for input(s): HGBA1C, LABURIC, CREATINE in the last 8760 hours. No specialty comments available. No problems updated.  OBJECTIVE:  VS:  HT:5\' 9"  (175.3 cm)   WT:158 lb 6.4 oz (71.8 kg)  BMI:23.38    BP:120/82  HR:71bpm  TEMP: ( )  RESP:96 %   PHYSICAL EXAM: Constitutional: WDWN, Non-toxic appearing. Psychiatric: Alert & appropriately interactive.  Not depressed or anxious appearing. Respiratory: No increased work of breathing.  Trachea Midline Eyes: Pupils are equal.  EOM intact without nystagmus.  No scleral icterus  Vascular Exam: warm to touch no edema  lower extremity neuro exam: unremarkable normal strength normal sensation  MSK Exam: Ankle has TTP over the syndesmosis most focally.  He has no pain with cotton testing or with talar tilt, Klier testing and good stability with anterior drawer.   ASSESSMENT & PLAN:   1. Right ankle pain, unspecified chronicity   2. High ankle sprain of right lower extremity, initial encounter     PLAN: We discussed that the likelihood of a high ankle sprain continuing to cause some discomfort at this time is normal.  Begin working on therapeutic exercises including four-way ankle.  Body Helix compression sleeve discussed.  Slowly return back to activities over the next 6 weeks if any persistent ongoing symptoms can consider further diagnostic evaluation with MRI but given the normal x-rays previously obtained likely of significant osseous injury is low.  Follow-up: Return in about 6 weeks (around 11/24/2017).  Please see additional documentation for Objective, Assessment and Plan sections. Pertinent additional documentation may be included in corresponding procedure notes, imaging studies, problem based documentation and patient instructions. Please see these sections of the encounter for additional information regarding this visit.  CMA/ATC served as Education administrator  during this visit. History, Physical, and Plan performed by medical provider. Documentation and orders reviewed and attested to.      Gerda Diss, [facility] Physician

## 2017-10-13 ENCOUNTER — Ambulatory Visit (INDEPENDENT_AMBULATORY_CARE_PROVIDER_SITE_OTHER): Payer: BLUE CROSS/BLUE SHIELD | Admitting: Sports Medicine

## 2017-10-13 ENCOUNTER — Encounter: Payer: Self-pay | Admitting: Sports Medicine

## 2017-10-13 VITALS — BP 120/82 | HR 71 | Ht 69.0 in | Wt 158.4 lb

## 2017-10-13 DIAGNOSIS — S93431A Sprain of tibiofibular ligament of right ankle, initial encounter: Secondary | ICD-10-CM | POA: Diagnosis not present

## 2017-10-13 DIAGNOSIS — S93491A Sprain of other ligament of right ankle, initial encounter: Secondary | ICD-10-CM

## 2017-10-13 DIAGNOSIS — M25571 Pain in right ankle and joints of right foot: Secondary | ICD-10-CM | POA: Diagnosis not present

## 2017-10-13 NOTE — Patient Instructions (Signed)
I recommend you obtained a compression sleeve to help with your joint problems. There are many options on the market however I recommend obtaining a small Full Ankle Body Helix compression sleeve.  You can find information (including how to appropriate measure yourself for sizing) can be found at [email or web address].  Many of these products are health savings account (HSA) eligible.   You can use the compression sleeve at any time throughout the day but is most important to use while being active as well as for 2 hours post-activity.   It is appropriate to ice following activity with the compression sleeve in place.

## 2017-10-24 ENCOUNTER — Encounter: Payer: Self-pay | Admitting: Sports Medicine

## 2017-11-17 ENCOUNTER — Encounter: Payer: Self-pay | Admitting: Sports Medicine

## 2017-11-17 ENCOUNTER — Ambulatory Visit (INDEPENDENT_AMBULATORY_CARE_PROVIDER_SITE_OTHER): Payer: BLUE CROSS/BLUE SHIELD | Admitting: Sports Medicine

## 2017-11-17 VITALS — BP 108/66 | HR 78 | Ht 69.0 in | Wt 160.0 lb

## 2017-11-17 DIAGNOSIS — G8929 Other chronic pain: Secondary | ICD-10-CM | POA: Diagnosis not present

## 2017-11-17 DIAGNOSIS — S93491A Sprain of other ligament of right ankle, initial encounter: Secondary | ICD-10-CM

## 2017-11-17 DIAGNOSIS — M25561 Pain in right knee: Secondary | ICD-10-CM

## 2017-11-17 DIAGNOSIS — S93431A Sprain of tibiofibular ligament of right ankle, initial encounter: Secondary | ICD-10-CM | POA: Diagnosis not present

## 2017-11-17 DIAGNOSIS — M25571 Pain in right ankle and joints of right foot: Secondary | ICD-10-CM

## 2017-11-17 NOTE — Progress Notes (Signed)
PROCEDURE NOTE: THERAPEUTIC EXERCISES (97110) 15 minutes spent for Therapeutic exercises as below and as referenced in the AVS.  This included exercises focusing on stretching, strengthening, with significant focus on eccentric aspects.   Proper technique shown and discussed handout in great detail with ATC.  All questions were discussed and answered.   Long term goals include an improvement in range of motion, strength, endurance as well as avoiding reinjury. Frequency of visits is one time as determined during today's  office visit. Frequency of exercises to be performed is as per handout.  EXERCISES REVIEWED:  Hip ABduction strengthening with focus on Glute Medius Recruitment  VMO Strengthening  ITB stretching

## 2017-11-17 NOTE — Progress Notes (Signed)
[facility]ells. Del[facility] at [facility] [phone]  Reuel Lamadrid Sigala - 32 y.o. male MRN [id number]  Date of birth: 10-25-1985  Visit Date: 11/17/2017  PCP: Ardith Dark, MD   Referred by: Ardith Dark, MD  Scribe(s) for today's visit: Christoper Fabian, LAT, ATC  SUBJECTIVE:  Dovid Bartko Gallina is here for Follow-up (R high ankle sprain) .    His R ankle pain  symptoms INITIALLY: Began about 5 weeks ago and started after running into curb with scooter. He hit the medial aspect of the R ankle on the concrete.  Described as mild-severe burning that comes and goes, non-radiating. Worsened with plantar flexion and inversion Improved with rest. He denies pain with weight bearing.  Additional associated symptoms include: He c/o continued swelling around the ankle.    At this time symptoms are improving compared to onset. He has been taking Naproxen prn. He tried icing the ankle for a while but he hasn't been for the past week. He has been doing HEP for ankle with little trouble.   He had XR done in [place] at [facility].   He also c/o R knee pain. He has been seen for this in the past. Pain is around the patella. He has noticed some swelling around the knee. The area is tender to palpation.   11/17/17: Compared to the last office visit on 10/13/17, his previously described R ankle pain symptoms are improving. Current symptoms are mild-moderate & are nonradiating He has been taking Naproxen and using a Body Helix compression sleeve.   REVIEW OF SYSTEMS: Denies night time disturbances. Denies fevers, chills, or night sweats. Denies unexplained weight loss. Denies personal history of cancer. Denies changes in bowel or bladder habits. Denies recent unreported falls. Denies new or worsening dyspnea or wheezing. Denies headaches or dizziness.  Denies numbness, tingling or weakness  In the extremities.  Denies  dizziness or presyncopal episodes Reports lower extremity edema -right ankle, not new   HISTORY & PERTINENT PRIOR DATA:  Significant/pertinent history, findings, studies include:  reports that he quit smoking about 11 years ago. His smokeless tobacco use includes chew. No results for input(s): HGBA1C, LABURIC, CREATINE in the last 8760 hours. No specialty comments available. No problems updated.  Otherwise prior history reviewed and updated per electronic medical record.    OBJECTIVE:  VS:  HT:5\' 9"  (175.3 cm)   WT:160 lb (72.6 kg)  BMI:23.62    BP:108/66  HR:78bpm  TEMP: ( )  RESP:96 %   PHYSICAL EXAM: CONSTITUTIONAL: Well-developed, Well-nourished and In no acute distress Alert & appropriately interactive. and Not depressed or anxious appearing. RESPIRATORY: No increased work of breathing and Trachea Midline EYES: Pupils are equal., EOM intact without nystagmus. and No scleral icterus.  Lower extremities: Warm and well perfused Pulses: DP Pulses: Bilaterally normal and symmetric PT Pulses: Bilaterally normal and symmetric Edema: Joint associated swelling as per Orthopedic exam and No Pre-tibial edema NEURO: unremarkable Normal associated myotomal distribution strength to manual muscle testing Normal sensation to light touch  MSK Exam: Left ankle: . Well aligned, no significant deformity. . No overlying skin changes. . TTP over Anterior lateral and anterior joint line directly over the syndesmosis.  Mild pain throughout. . Range of motion is overall well-maintained with slight loss of inversion actively and slight loss of dorsiflexion and plantar flexion by 10 degrees approximately with slight pain at the end range but this is minimal.  This was not push today for stability purposes. . Ligamentously stable to Anterior drawer, talar tilting and clear testing.  Normal cotton test. .   Knee: Overall well aligned.  He has slight weakness with hip abduction and VMO weakness  with a hip flexor tightness that is mild.  PROCEDURES & DATA REVIEWED:  . Osteopathic manipulation was performed today based on physical exam findings.  Please see procedure note for further information including Osteopathic Exam findings . Discussed the foundation of treatment for this condition is physical therapy and/or daily (5-6 days/week) therapeutic exercises, focusing on core strengthening, coordination, neuromuscular control/reeducation.  Therapeutic exercises prescribed per procedure note.  ASSESSMENT  No diagnosis found.  PLAN:       . Begin to increase his activities slowly over the next several weeks. . Continue with ankle bracing, ice, rest as needed. Marland Kitchen. He will continue to improve although slowly with a high ankle sprain. Marland Kitchen. Please see procedure section and notes. No problem-specific Assessment & Plan notes found for this encounter.  Follow-up: Return in about 6 weeks (around 12/29/2017).      Please see additional documentation for Objective, Assessment and Plan sections. Pertinent additional documentation may be included in corresponding procedure notes, imaging studies, problem based documentation and patient instructions. Please see these sections of the encounter for additional information regarding this visit.  CMA/ATC served as Neurosurgeonscribe during this visit. History, Physical, and Plan performed by medical provider. Documentation and orders reviewed and attested to.      Andrena MewsMichael D Rigby, DO    [facility] Physician

## 2017-11-17 NOTE — Patient Instructions (Addendum)
Please perform the exercise program that we have prepared for you and gone over in detail on a daily basis.  In addition to the handout you were provided you can access your program through: [email or web address]   Your unique program code is:  [email or web address]

## 2017-11-17 NOTE — Progress Notes (Signed)
PROCEDURE NOTE : OSTEOPATHIC MANIPULATION The decision today to treat with Osteopathic Manipulative Therapy (OMT) was based on physical exam findings. Verbal consent was obtained following a discussion with the patient regarding the of risks, benefits and potential side effects, including an acute pain flare,post manipulation soreness and need for repeat treatments.     Contraindications to OMT: NONE  Manipulation was performed as below: Regions treated: Lower extremities OMT Techniques Used: HVLA, muscle energy and myofascial release  The patient tolerated the treatment well and reported Improved symptoms following treatment today. Patient was given medications, exercises, stretches and lifestyle modifications per AVS and verbally.   OSTEOPATHIC/STRUCTURAL EXAM:   Plantar flexed first ray Left anterior tibia on talus

## 2017-11-25 ENCOUNTER — Ambulatory Visit: Payer: BLUE CROSS/BLUE SHIELD | Admitting: Sports Medicine

## 2017-12-29 ENCOUNTER — Ambulatory Visit: Payer: BLUE CROSS/BLUE SHIELD | Admitting: Family Medicine

## 2018-01-04 ENCOUNTER — Ambulatory Visit: Payer: BLUE CROSS/BLUE SHIELD | Admitting: Sports Medicine

## 2018-01-06 ENCOUNTER — Encounter: Payer: Self-pay | Admitting: Sports Medicine

## 2018-01-06 ENCOUNTER — Ambulatory Visit (INDEPENDENT_AMBULATORY_CARE_PROVIDER_SITE_OTHER): Payer: BLUE CROSS/BLUE SHIELD | Admitting: Sports Medicine

## 2018-01-06 VITALS — BP 112/72 | HR 69 | Ht 69.0 in | Wt 153.6 lb

## 2018-01-06 DIAGNOSIS — S93491A Sprain of other ligament of right ankle, initial encounter: Secondary | ICD-10-CM

## 2018-01-06 DIAGNOSIS — G8929 Other chronic pain: Secondary | ICD-10-CM

## 2018-01-06 DIAGNOSIS — M25561 Pain in right knee: Secondary | ICD-10-CM

## 2018-01-06 DIAGNOSIS — M25571 Pain in right ankle and joints of right foot: Secondary | ICD-10-CM

## 2018-01-06 DIAGNOSIS — S93431A Sprain of tibiofibular ligament of right ankle, initial encounter: Secondary | ICD-10-CM | POA: Diagnosis not present

## 2018-01-06 DIAGNOSIS — R269 Unspecified abnormalities of gait and mobility: Secondary | ICD-10-CM | POA: Diagnosis not present

## 2018-01-06 NOTE — Progress Notes (Signed)
Veverly FellsMichael D. Delorise Shinerigby, DO  [facility] [place] [phone]  Jeffery Reidndrew Patrick Burrows - 32 y.o. male MRN [id number]  Date of birth: September 02, 1985  Visit Date: 01/06/2018  PCP: Ardith DarkParker, Caleb M, MD   Referred by: Ardith DarkParker, Caleb M, MD  Scribe(s) for today's visit: Stevenson ClinchBrandy Coleman, CMA  SUBJECTIVE:  Jeffery Le is here for Follow-up (R ankle sprain)   10/13/2017: His R ankle pain  symptoms INITIALLY: Began about 5 weeks ago and started after running into curb with scooter. He hit the medial aspect of the R ankle on the concrete.  Described as mild-severe burning that comes and goes, non-radiating. Worsened with plantar flexion and inversion Improved with rest. He denies pain with weight bearing.  Additional associated symptoms include: He c/o continued swelling around the ankle.   At this time symptoms are improving compared to onset. He has been taking Naproxen prn. He tried icing the ankle for a while but he hasn't been for the past week. He has been doing HEP for ankle with little trouble.  [place] at [facility].  He also c/o R knee pain. He has been seen for this in the past. Pain is around the patella. He has noticed some swelling around the knee. The area is tender to palpation.   11/17/17: Compared to the last office visit on 10/13/17, his previously described R ankle pain symptoms are improving. Current symptoms are mild-moderate & are nonradiating He has been taking Naproxen and using a Body Helix compression sleeve.  01/06/2018: Compared to the last office visit, his previously described symptoms are improving. He continues to have swelling on the medial aspect of the ankle. He caught his heel on the stairs yesterday and that caused increased pain. He has started running again, he has a little soreness after running. He is able to walk with minimal relief.  Current symptoms are mild & are nonradiating He  has been doing HEP with no trouble. He reports that he still has some snapping while doing the stretches but it seems to be improving. He takes Naproxen rarely for pain/inflammation. He wears Body Helix compression sleeve while running.    REVIEW OF SYSTEMS: Denies night time disturbances. Denies fevers, chills, or night sweats. Denies unexplained weight loss. Denies personal history of cancer. Denies changes in bowel or bladder habits. Denies recent unreported falls. Denies new or worsening dyspnea or wheezing. Denies headaches or dizziness.  Denies numbness, tingling or weakness  In the extremities.  Denies dizziness or presyncopal episodes Reports lower extremity edema    HISTORY & PERTINENT PRIOR DATA:  Prior History reviewed and updated per electronic medical record.  Significant/pertinent history, findings, studies include:  reports that he quit smoking about 11 years ago. His smokeless tobacco use includes chew. No results for input(s): HGBA1C, LABURIC, CREATINE in the last 8760 hours. No specialty comments available. No problems updated.  OBJECTIVE:  VS:  HT:5\' 9"  (175.3 cm)   WT:153 lb 9.6 oz (69.7 kg)  BMI:22.67    BP:112/72  HR:69bpm  TEMP: ( )  RESP:97 %   PHYSICAL EXAM: CONSTITUTIONAL: Well-developed, Well-nourished and In no acute distress Alert & appropriately interactive. and Not depressed or anxious appearing. Respiratory: No increased work of breathing.  Trachea Midline EYES: Pupils are equal., EOM intact without nystagmus. and No scleral icterus.  Lower EXTREMITY EXAM: Warm and well perfused NEURO: unremarkable  MSK Exam: Right ankle:: . Well aligned, no significant  deformity. . No overlying skin changes. TTP over Anterior lateral and anterior medial ankle mildly.  Significantly improved. Non tender over Posterior aspect of medial or lateral malleolus, base of fifth metatarsal or navicular bones. . Ankle inversion, eversion, plantarflexion and  dorsiflexion strength and range of motion are normal. . Ligamentously stable to Anterior drawer, talar tilt . Normal, non-painful: Anterior drawer and cotton test . Positive/Abnormal: Slight pain with Kleiger  The exam is overall well aligned without significant deformity.  No focal bony tenderness or significant meniscal symptoms.  See OMT exam.  PROCEDURES & DATA REVIEWED:  PROCEDURE NOTE : OSTEOPATHIC MANIPULATION The decision today to treat with Osteopathic Manipulative Therapy (OMT) was based on physical exam findings. Verbal consent was obtained following a discussion with the patient regarding the of risks, benefits and potential side effects, including an acute pain flare,post manipulation soreness and need for repeat treatments.     Contraindications to OMT: NONE  Manipulation was performed as below: Regions treated: Lower extremities OMT Techniques Used: HVLA, muscle energy, myofascial release and soft tissue  The patient tolerated the treatment well and reported Improved symptoms following treatment today. Patient was given medications, exercises, stretches and lifestyle modifications per AVS and verbally.   OSTEOPATHIC/STRUCTURAL EXAM:   Right lower extremity with a tibial extorsion and plantarflexed ankle.  Midfoot flexion   ASSESSMENT   1. Right ankle pain, unspecified chronicity   2. High ankle sprain of right lower extremity, initial encounter   3. Acute right ankle pain   4. Gait disturbance   5. Chronic pain of right knee     PLAN:  Sprain.  We will have him follow-up in 6 weeks to further discuss his knee pain if it is persistent after the exercises that he was previously provided as well as osteopathic manipulation is performed to both the knee and the ankle today.  If any persistent symptoms with the ankle could consider injection.  Continue with Body Helix Compression Sleeve ankle compression sleeve during exercise and as needed during day-to-day activities     Follow-up: Return in about 6 weeks (around 02/17/2018) for repeat clinical exam.  Will discuss knee pain at follow-up likely perform diagnostic ultrasound at that time.     Please see additional documentation for Objective, Assessment and Plan sections. Pertinent additional documentation may be included in corresponding procedure notes, imaging studies, problem based documentation and patient instructions. Please see these sections of the encounter for additional information regarding this visit.  CMA/ATC served as Neurosurgeonscribe during this visit. History, Physical, and Plan performed by medical provider. Documentation and orders reviewed and attested to.      Andrena MewsMichael D Rigby, DO    [facility] Physician

## 2018-01-15 ENCOUNTER — Encounter: Payer: Self-pay | Admitting: Sports Medicine

## 2018-01-16 DIAGNOSIS — H15122 Nodular episcleritis, left eye: Secondary | ICD-10-CM | POA: Diagnosis not present

## 2018-01-30 ENCOUNTER — Encounter: Payer: Self-pay | Admitting: Family Medicine

## 2018-01-30 ENCOUNTER — Ambulatory Visit (INDEPENDENT_AMBULATORY_CARE_PROVIDER_SITE_OTHER): Payer: BLUE CROSS/BLUE SHIELD | Admitting: Family Medicine

## 2018-01-30 VITALS — BP 122/74 | HR 62 | Temp 98.4°F | Ht 69.0 in | Wt 156.2 lb

## 2018-01-30 DIAGNOSIS — J00 Acute nasopharyngitis [common cold]: Secondary | ICD-10-CM | POA: Diagnosis not present

## 2018-01-30 MED ORDER — PREDNISONE 20 MG PO TABS: ORAL_TABLET | ORAL | 0 refills | Status: DC

## 2018-01-30 NOTE — Progress Notes (Signed)
PCP: Ardith Dark, MD  Subjective:  Jeffery Le is a 32 y.o. 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  About a week ago started to notice chest congestion Cough has developed. Dry cough.  He feels it in his chest in the middle of the night- like a gurgle or wheeze Some itchy throat Doesn't feel run down.  Feels perhaps mildly winded at times but is able to run with no issues- 5 of 7 days a week.  No fever. No sinus pressure. 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 Right knee pain 12/28/2016  . Gait disturbance 12/28/2016   Medications- reviewed  Current Outpatient Medications  Medication Sig Dispense Refill  . Multiple Vitamin (MULTIVITAMIN WITH MINERALS) TABS Take 1 tablet by mouth daily.    . naproxen sodium (ALEVE) 220 MG tablet Take 220 mg by mouth.     No current facility-administered medications for this visit.     Objective: BP 122/74 (BP Location: Left Arm, Patient Position: Sitting, Cuff Size: Large)   Pulse 62   Temp 98.4 F (36.9 C) (Oral)   Ht 5\' 9"  (1.753 m)   Wt 156 lb 3.2 oz (70.9 kg)   SpO2 98%   BMI 23.07 kg/m  Gen: NAD, resting comfortably HEENT: Turbinates erythematous, TM normal, pharynx moderately erythematous with no tonsilar exudate or edema, no sinus tenderness CV: RRR no murmurs rubs or gallops Lungs: CTAB no crackles, wheeze, rhonchi Ext: no edema Skin: warm, dry, no  rash  Assessment/Plan:  Upper Respiratory infection History and exam today are suggestive of viral infection most likely due to upper respiratory infection. Symptomatic treatment with: plain mucinex  We discussed that we did not find any infection that had higher probability of being bacterial such as pneumonia or strep throat. We discussed signs that bacterial infection may have developed particularly fever or shortness of breath. Likely course of 2 weeks. Patient is contagious and advised good handwashing and consideration of mask If going to be in public places.   Finally, we reviewed reasons to return to care including if symptoms worsen or persist or new concerns arise- once again particularly shortness of breath or fever.  We opted to give a course of prednisone if symptoms havent improved by Thursday- see printed prescription. If after that 7 days you aren't improved, see [place] back or send me a message and we could consider x-ray or just repeating lung evaluation.   No orders of the defined types were placed in this encounter.   Tana Conch, MD

## 2018-01-30 NOTE — Patient Instructions (Addendum)
Health Maintenance Due  Topic Date Due  . INFLUENZA VACCINE -schedule this Fall 12/22/2017   Upper Respiratory infection History and exam today are suggestive of viral infection most likely due to upper respiratory infection. Symptomatic treatment with: plain mucinex  We discussed that we did not find any infection that had higher probability of being bacterial such as pneumonia or strep throat. We discussed signs that bacterial infection may have developed particularly fever or shortness of breath. Likely course of 2 weeks. Patient is contagious and advised good handwashing and consideration of mask If going to be in public places.   Finally, we reviewed reasons to return to care including if symptoms worsen or persist or new concerns arise- once again particularly shortness of breath or fever.  We opted to give a course of prednisone if symptoms havent improved by Thursday- see printed prescription. If after that 7 days you aren't improved, see [place] back or send me a message and we could consider x-ray or just repeating lung evaluation.

## 2018-02-03 ENCOUNTER — Telehealth: Payer: Self-pay | Admitting: Family Medicine

## 2018-02-03 ENCOUNTER — Other Ambulatory Visit: Payer: Self-pay

## 2018-02-03 MED ORDER — PREDNISONE 20 MG PO TABS: ORAL_TABLET | ORAL | 0 refills | Status: DC

## 2018-02-03 NOTE — Telephone Encounter (Signed)
Rx has been sent to pharmacy

## 2018-02-03 NOTE — Telephone Encounter (Signed)
Copied from CRM [phone]. Topic: Quick Communication - Rx Refill/Question >> Feb 03, 2018  2:12 PM Crist InfanteHarrald, Kathy J wrote: Medication: predniSONE (DELTASONE) 20 MG tablet 10 tabs  Pt saw Dr Durene CalHunter on Monday, 9/9. Pt advised if not better to start this Rx .  Pt states he is not better, but has gone out of town for work in [place]. Pt left the Rx at home. Requesting if it can be called in to [facility] [address] [place], [place] [id number] Phone  [phone] Can you call the pt and let him know its been approved?

## 2018-02-10 ENCOUNTER — Ambulatory Visit (INDEPENDENT_AMBULATORY_CARE_PROVIDER_SITE_OTHER): Payer: BLUE CROSS/BLUE SHIELD | Admitting: Physician Assistant

## 2018-02-10 ENCOUNTER — Encounter: Payer: Self-pay | Admitting: Physician Assistant

## 2018-02-10 VITALS — BP 122/88 | HR 55 | Temp 98.1°F | Ht 69.0 in | Wt 155.5 lb

## 2018-02-10 DIAGNOSIS — J40 Bronchitis, not specified as acute or chronic: Secondary | ICD-10-CM

## 2018-02-10 MED ORDER — AZITHROMYCIN 250 MG PO TABS: ORAL_TABLET | ORAL | 0 refills | Status: DC

## 2018-02-10 NOTE — Patient Instructions (Signed)
It was great to see you!  Use medication as prescribed: Azithromycin  Push fluids and get plenty of rest. Please return if you are not improving as expected, or if you have high fevers (>101.5) or difficulty swallowing or worsening productive cough.  Call clinic with questions.  I hope you start feeling better soon!   Cough, Adult Coughing is a reflex that clears your throat and your airways. Coughing helps to heal and protect your lungs. It is normal to cough occasionally, but a cough that happens with other symptoms or lasts a long time may be a sign of a condition that needs treatment. A cough may last only 2-3 weeks (acute), or it may last longer than 8 weeks (chronic). What are the causes? Coughing is commonly caused by:  Breathing in substances that irritate your lungs.  A viral or bacterial respiratory infection.  Allergies.  Asthma.  Postnasal drip.  Smoking.  Acid backing up from the stomach into the esophagus (gastroesophageal reflux).  Certain medicines.  Chronic lung problems, including COPD (or rarely, lung cancer).  Other medical conditions such as heart failure.  Follow these instructions at home: Pay attention to any changes in your symptoms. Take these actions to help with your discomfort:  Take medicines only as told by your health care provider. ? If you were prescribed an antibiotic medicine, take it as told by your health care provider. Do not stop taking the antibiotic even if you start to feel better. ? Talk with your health care provider before you take a cough suppressant medicine.  Drink enough fluid to keep your urine clear or pale yellow.  If the air is dry, use a cold steam vaporizer or humidifier in your bedroom or your home to help loosen secretions.  Avoid anything that causes you to cough at work or at home.  If your cough is worse at night, try sleeping in a semi-upright position.  Avoid cigarette smoke. If you smoke, quit smoking.  If you need help quitting, ask your health care provider.  Avoid caffeine.  Avoid alcohol.  Rest as needed.  Contact a health care provider if:  You have new symptoms.  You cough up pus.  Your cough does not get better after 2-3 weeks, or your cough gets worse.  You cannot control your cough with suppressant medicines and you are losing sleep.  You develop pain that is getting worse or pain that is not controlled with pain medicines.  You have a fever.  You have unexplained weight loss.  You have night sweats. Get help right away if:  You cough up blood.  You have difficulty breathing.  Your heartbeat is very fast. This information is not intended to replace advice given to you by your health care provider. Make sure you discuss any questions you have with your health care provider. Document Released: 11/06/2010 Document Revised: 10/16/2015 Document Reviewed: 07/17/2014 Elsevier Interactive Patient Education  [employer]2018 Elsevier Inc.

## 2018-02-10 NOTE — Progress Notes (Signed)
Jeffery Le is a 32 y.o. male here for a recurrent cough.  I acted as a Neurosurgeon for [employer], PA-C Corky Mull, LPN  History of Present Illness:   Chief Complaint  Patient presents with  . Cough    Cough  This is a new problem. Episode onset: Started 2 1/2 weeks ago. The problem has been gradually improving. The problem occurs every few hours. The cough is productive of sputum. Associated symptoms include postnasal drip and wheezing (at night). Pertinent negatives include no chills, ear congestion, ear pain, fever, headaches, nasal congestion, sore throat or shortness of breath. Risk factors for lung disease include travel and occupational exposure. Treatments tried: Mucinex, Prednisone. The treatment provided mild relief.   Mucinex didn't really help. Took most of the prednisone, has two pills left. 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 No history of PNA. Former smoker (college.) Has been exposed to some dust and other things in his home remodel recently. He is a runner and has still been able to run during the past week without any changes in effort.  Wt Readings from Last 5 Encounters:  02/10/18 155 lb 8 oz (70.5 kg)  01/30/18 156 lb 3.2 oz (70.9 kg)  01/06/18 153 lb 9.6 oz (69.7 kg)  11/17/17 160 lb (72.6 kg)  10/13/17 158 lb 6.4 oz (71.8 kg)     No past medical history on file.   Social History   Socioeconomic History  . Marital status: Single    Spouse name: Not on file  . Number of children: 0  . Years of education: Not on file  . Highest education level: Not on file  Occupational History  . Occupation: Lexicographer  . Financial resource strain: Not on file  . Food insecurity:    Worry: Not on file    Inability: Not on file  . Transportation needs:    Medical: Not on file    Non-medical: Not on file  Tobacco Use  . Smoking status: Former Smoker    Last attempt  to quit: 2008    Years since quitting: 11.7  . Smokeless tobacco: Current User    Types: Chew  Substance and Sexual Activity  . Alcohol use: Yes  . Drug use: No  . Sexual activity: Yes    Partners: Female  Lifestyle  . Physical activity:    Days per week: Not on file    Minutes per session: Not on file  . Stress: Not on file  Relationships  . Social connections:    Talks on phone: Not on file    Gets together: Not on file    Attends religious service: Not on file    Active member of club or organization: Not on file    Attends meetings of clubs or organizations: Not on file    Relationship status: Not on file  . Intimate partner violence:    Fear of current or ex partner: Not on file    Emotionally abused: Not on file    Physically abused: Not on file    Forced sexual activity: Not on file  Other Topics Concern  . Not on file  Social History Narrative  . Not on file    Past Surgical History:  Procedure Laterality Date  . ear tues Bilateral     Family History  Problem Relation Age of Onset  . Diabetes  Other        Paternal side  . Alzheimer's disease Other        Maternal side  . Hyperlipidemia Father     Allergies  Allergen Reactions  . Ceclor [Cefaclor]     Unknown   . Cephalosporins Other (See Comments)    unknown    Current Medications:   Current Outpatient Medications:  Marland Kitchen.  Multiple Vitamin (MULTIVITAMIN WITH MINERALS) TABS, Take 1 tablet by mouth daily., Disp: , Rfl:  .  naproxen sodium (ALEVE) 220 MG tablet, Take 220 mg by mouth., Disp: , Rfl:  .  azithromycin (ZITHROMAX) 250 MG tablet, Take two tabs on day 1, then one tab daily x 4 days, Disp: 6 tablet, Rfl: 0 .  predniSONE (DELTASONE) 10 MG tablet, TAKE 4 TABS DAILY FOR 3 DAYS,2 TABS FOR 4 DAYS, Disp: , Rfl: 0   Review of Systems:   Review of Systems  Constitutional: Negative for chills and fever.  HENT: Positive for postnasal drip. Negative for ear pain and sore throat.   Respiratory:  Positive for cough and wheezing (at night). Negative for shortness of breath.   Neurological: Negative for headaches.    Vitals:   Vitals:   02/10/18 0937  BP: 122/88  Pulse: (!) 55  Temp: 98.1 F (36.7 C)  TempSrc: Oral  SpO2: 97%  Weight: 155 lb 8 oz (70.5 kg)  Height: 5\' 9"  (1.753 m)     Body mass index is 22.96 kg/m.  Physical Exam:   Physical Exam  Constitutional: He appears well-developed. He is cooperative.  Non-toxic appearance. He does not have a sickly appearance. He does not appear ill. No distress.  No cough throughout entire encounter  HENT:  Head: Normocephalic and atraumatic.  Right Ear: Tympanic membrane, external ear and ear canal normal. Tympanic membrane is not erythematous, not retracted and not bulging.  Left Ear: Tympanic membrane, external ear and ear canal normal. Tympanic membrane is not erythematous, not retracted and not bulging.  Nose: Mucosal edema and rhinorrhea present. Right sinus exhibits no maxillary sinus tenderness and no frontal sinus tenderness. Left sinus exhibits no maxillary sinus tenderness and no frontal sinus tenderness.  Mouth/Throat: Uvula is midline and mucous membranes are normal. Posterior oropharyngeal erythema present. No posterior oropharyngeal edema. Tonsils are 1+ on the right. Tonsils are 1+ on the left.  Eyes: Conjunctivae and lids are normal.  Neck: Trachea normal.  Cardiovascular: Normal rate, regular rhythm, S1 normal, S2 normal, normal heart sounds and normal pulses.  No LE edema  Pulmonary/Chest: Effort normal and breath sounds normal. He has no decreased breath sounds. He has no wheezes. He has no rhonchi. He has no rales.  Lymphadenopathy:    He has no cervical adenopathy.  Neurological: He is alert. GCS eye subscore is 4. GCS verbal subscore is 5. GCS motor subscore is 6.  Skin: Skin is warm, dry and intact.  Psychiatric: He has a normal mood and affect. His speech is normal and behavior is normal.  Nursing note  and vitals reviewed.   Assessment and Plan:    Jeffery Le was seen today for cough.  Diagnoses and all orders for this visit:  Bronchitis  Other orders -     azithromycin (ZITHROMAX) 250 MG tablet; Take two tabs on day 1, then one tab daily x 4 days   No red flags on exam.  Will initiate Azitromycin per orders. Discussed that he likely has possible bronchitis at this point, however due to the exposures  that he has had working on remodeling his home will treat with Azithromycin antibiotic. Vitals stable. Discussed option of chest xray but after shared decision making we deferred this to later time. I also discussed taking antihistamine to help with post nasal drip, as well as Delsym. Discussed taking medications as prescribed. Reviewed return precautions including worsening fever, SOB, worsening cough or other concerns. Push fluids and rest. I recommend that patient follow-up if symptoms worsen or persist despite treatment x 7-10 days, sooner if needed.   . Reviewed expectations re: course of current medical issues. . Discussed self-management of symptoms. . Outlined signs and symptoms indicating need for more acute intervention. . Patient verbalized understanding and all questions were answered. . See orders for this visit as documented in the electronic medical record. . Patient received an After-Visit Summary.  CMA or LPN served as scribe during this visit. History, Physical, and Plan performed by medical provider. The above documentation has been reviewed and is accurate and complete.   Jarold Motto, PA-C

## 2018-02-15 ENCOUNTER — Ambulatory Visit (INDEPENDENT_AMBULATORY_CARE_PROVIDER_SITE_OTHER): Payer: BLUE CROSS/BLUE SHIELD

## 2018-02-15 ENCOUNTER — Ambulatory Visit (INDEPENDENT_AMBULATORY_CARE_PROVIDER_SITE_OTHER): Payer: BLUE CROSS/BLUE SHIELD | Admitting: Physician Assistant

## 2018-02-15 ENCOUNTER — Encounter: Payer: Self-pay | Admitting: Physician Assistant

## 2018-02-15 VITALS — BP 126/80 | HR 55 | Temp 98.0°F | Ht 69.0 in | Wt 157.0 lb

## 2018-02-15 DIAGNOSIS — R053 Chronic cough: Secondary | ICD-10-CM

## 2018-02-15 DIAGNOSIS — R05 Cough: Secondary | ICD-10-CM

## 2018-02-15 NOTE — Progress Notes (Signed)
Jeffery Le is a 32 y.o. male here for a chronic cough.  I acted as a Neurosurgeon for [employer], PA-C Corky Mull, LPN  History of Present Illness:   Chief Complaint  Patient presents with  . Cough    HPI He is here to discuss his cough that started almost one month ago. When he last saw me on 02/10/18 he was diagnosed with bronchitis and treated with z-pack and prednisone. He has still been able to work out and run throughout the duration of his cough.  He has occasionally had some clear mucus that he has coughed up with occasional tinge of blood or the "taste of blood" in his mouth. Denies snoring on a regular basis. He has been online and reading about "bloody cough" and is concerned that he has cancer or something serious. He does have a history of PNA in 2017 that was treated as an outpatient with antibiotics. Had subjective wheezing that went away on its own yesterday. Denies: chest pain, SOB, night sweats, recent prison/hospital exposure, unintentional weight loss, fatigue, LE swelling.  Of note, he had a visit with PCP this past March with concerns for cough, wanted reassurance that nothing was wrong at that time as well. We discussed this briefly and he states that his symptoms at that time did not last as long.   He has taken some intermittent antihistamines with some relief.   History reviewed. No pertinent past medical history.   Social History   Socioeconomic History  . Marital status: Single    Spouse name: Not on file  . Number of children: 0  . Years of education: Not on file  . Highest education level: Not on file  Occupational History  . Occupation: Lexicographer  . Financial resource strain: Not on file  . Food insecurity:    Worry: Not on file    Inability: Not on file  . Transportation needs:    Medical: Not on file    Non-medical: Not on file  Tobacco Use  . Smoking status: Former Smoker    Last attempt to quit: 2008    Years since  quitting: 11.7  . Smokeless tobacco: Current User    Types: Chew  Substance and Sexual Activity  . Alcohol use: Yes  . Drug use: No  . Sexual activity: Yes    Partners: Female  Lifestyle  . Physical activity:    Days per week: Not on file    Minutes per session: Not on file  . Stress: Not on file  Relationships  . Social connections:    Talks on phone: Not on file    Gets together: Not on file    Attends religious service: Not on file    Active member of club or organization: Not on file    Attends meetings of clubs or organizations: Not on file    Relationship status: Not on file  . Intimate partner violence:    Fear of current or ex partner: Not on file    Emotionally abused: Not on file    Physically abused: Not on file    Forced sexual activity: Not on file  Other Topics Concern  . Not on file  Social History Narrative  . Not on file    Past Surgical History:  Procedure Laterality Date  . ear tues Bilateral     Family History  Problem Relation Age of Onset  . Diabetes Other  Paternal side  . Alzheimer's disease Other        Maternal side  . Hyperlipidemia Father     Allergies  Allergen Reactions  . Ceclor [Cefaclor]     Unknown   . Cephalosporins Other (See Comments)    unknown    Current Medications:   Current Outpatient Medications:  Marland Kitchen.  Multiple Vitamin (MULTIVITAMIN WITH MINERALS) TABS, Take 1 tablet by mouth daily., Disp: , Rfl:  .  naproxen sodium (ALEVE) 220 MG tablet, Take 220 mg by mouth., Disp: , Rfl:  .  predniSONE (DELTASONE) 10 MG tablet, TAKE 4 TABS DAILY FOR 3 DAYS,2 TABS FOR 4 DAYS, Disp: , Rfl: 0   Review of Systems:   Review of Systems  Constitutional: Negative for chills, fever, malaise/fatigue and weight loss.  Respiratory: Positive for cough, sputum production and wheezing. Negative for hemoptysis and shortness of breath.   Cardiovascular: Negative for chest pain, orthopnea, claudication and leg swelling.   Gastrointestinal: Negative for heartburn, nausea and vomiting.  Neurological: Negative for dizziness, tingling and headaches.    Vitals:   Vitals:   02/15/18 1400  BP: 126/80  Pulse: (!) 55  Temp: 98 F (36.7 C)  TempSrc: Oral  SpO2: 97%  Weight: 157 lb (71.2 kg)  Height: 5\' 9"  (1.753 m)     Body mass index is 23.18 kg/m.  Physical Exam:   Physical Exam  Constitutional: He appears well-developed. He is cooperative.  Non-toxic appearance. He does not have a sickly appearance. He does not appear ill. No distress.  HENT:  Head: Normocephalic and atraumatic.  Right Ear: Tympanic membrane, external ear and ear canal normal. Tympanic membrane is not erythematous, not retracted and not bulging.  Left Ear: Tympanic membrane, external ear and ear canal normal. Tympanic membrane is not erythematous, not retracted and not bulging.  Nose: Nose normal. Right sinus exhibits no maxillary sinus tenderness and no frontal sinus tenderness. Left sinus exhibits no maxillary sinus tenderness and no frontal sinus tenderness.  Mouth/Throat: Uvula is midline. No posterior oropharyngeal edema or posterior oropharyngeal erythema.  Eyes: Conjunctivae and lids are normal.  Neck: Trachea normal.  Cardiovascular: Normal rate, regular rhythm, S1 normal, S2 normal and normal heart sounds.  Pulmonary/Chest: Effort normal and breath sounds normal. He has no decreased breath sounds. He has no wheezes. He has no rhonchi. He has no rales.  Lymphadenopathy:    He has no cervical adenopathy.  Neurological: He is alert.  Skin: Skin is warm, dry and intact.  Psychiatric: He has a normal mood and affect. His speech is normal and behavior is normal.  Nursing note and vitals reviewed.  CXR: possible bronchitis  Assessment and Plan:    Jeffery Le was seen today for cough.  Diagnoses and all orders for this visit:  Chronic cough -     DG Chest 2 View; Future   No red flags on exam.  CXR without evidence of  PNA, but suggesting bronchitis. Will initiate Symbicort. Discussed taking medications as prescribed. Reviewed return precautions including worsening fever, SOB, worsening cough or other concerns. Push fluids and rest. I recommend that patient follow-up if symptoms worsen or persist despite treatment x 7-10 days, sooner if needed.  If symptoms persist, may consider pulmonary consult for further work-up.  . Reviewed expectations re: course of current medical issues. . Discussed self-management of symptoms. . Outlined signs and symptoms indicating need for more acute intervention. . Patient verbalized understanding and all questions were answered. . See orders for this  visit as documented in the electronic medical record. . Patient received an After-Visit Summary.  CMA or LPN served as scribe during this visit. History, Physical, and Plan performed by medical provider. The above documentation has been reviewed and is accurate and complete.   Inda Coke, PA-C

## 2018-02-16 ENCOUNTER — Encounter: Payer: Self-pay | Admitting: Physician Assistant

## 2018-02-16 ENCOUNTER — Other Ambulatory Visit: Payer: Self-pay | Admitting: *Deleted

## 2018-02-16 MED ORDER — BUDESONIDE-FORMOTEROL FUMARATE 160-4.5 MCG/ACT IN AERO: 1.0000 | INHALATION_SPRAY | Freq: Two times a day (BID) | RESPIRATORY_TRACT | 0 refills | Status: DC

## 2018-02-17 ENCOUNTER — Ambulatory Visit: Payer: BLUE CROSS/BLUE SHIELD | Admitting: Sports Medicine

## 2018-02-22 ENCOUNTER — Telehealth: Payer: Self-pay

## 2018-02-22 NOTE — Telephone Encounter (Signed)
Copied from CRM [id number]. Topic: Quick Communication - See Telephone Encounter >> Feb 22, 2018  1:37 PM Herby Abraham C wrote: CRM for notification. See Telephone encounter for: 02/22/18.  Pt says that pharmacy advised him that authorization is needed for budesonide-formoterol (SYMBICORT) 160-4.5 MCG/ACT inhaler. Pt would like further assistance with this.  CB: [id number]

## 2018-02-22 NOTE — Telephone Encounter (Signed)
PA is in progress.  Waiting for insurance decision. 

## 2018-02-23 ENCOUNTER — Telehealth: Payer: Self-pay | Admitting: Family Medicine

## 2018-02-23 ENCOUNTER — Ambulatory Visit: Payer: BLUE CROSS/BLUE SHIELD | Admitting: Sports Medicine

## 2018-02-23 NOTE — Telephone Encounter (Signed)
See note  Copied from CRM [id number]. Topic: General - Other >> Feb 23, 2018  9:09 AM Percival Spanish wrote:    [facility] said he need to have a question answered about the PA for budesonide-formoterol [facility]) 160-4.5 MCG/ACT inhaler   Call back number [phone]    ref number [id number]

## 2018-02-27 ENCOUNTER — Telehealth: Payer: Self-pay | Admitting: *Deleted

## 2018-02-27 NOTE — Telephone Encounter (Signed)
PA has been approved through 04/26/2018.

## 2018-02-27 NOTE — Telephone Encounter (Signed)
Left detailed message on personal voicemail that Symbicort has been approved by insurance. I will contact the pharmacy and let them know so it will be ready for you to pick up today. Any questions please call office.

## 2018-02-27 NOTE — Telephone Encounter (Signed)
Received fax from [facility] prior authorization for Symbicort has been approved 02/08/2018 through 04/26/2018.

## 2018-02-27 NOTE — Telephone Encounter (Signed)
Called pharmacy and spoke to April told her PA has been approved for Symbicort through Dec. April verbalized understanding and will get ready for pt.

## 2018-03-09 ENCOUNTER — Ambulatory Visit (INDEPENDENT_AMBULATORY_CARE_PROVIDER_SITE_OTHER): Payer: BLUE CROSS/BLUE SHIELD | Admitting: Family Medicine

## 2018-03-09 ENCOUNTER — Encounter: Payer: Self-pay | Admitting: Family Medicine

## 2018-03-09 VITALS — BP 124/74 | HR 71 | Temp 98.1°F | Ht 69.0 in | Wt 155.8 lb

## 2018-03-09 DIAGNOSIS — Z13220 Encounter for screening for lipoid disorders: Secondary | ICD-10-CM

## 2018-03-09 DIAGNOSIS — Z0001 Encounter for general adult medical examination with abnormal findings: Secondary | ICD-10-CM

## 2018-03-09 DIAGNOSIS — R05 Cough: Secondary | ICD-10-CM | POA: Diagnosis not present

## 2018-03-09 DIAGNOSIS — R059 Cough, unspecified: Secondary | ICD-10-CM | POA: Insufficient documentation

## 2018-03-09 LAB — CBC
HCT: 40.4 % (ref 39.0–52.0)
Hemoglobin: 13.8 g/dL (ref 13.0–17.0)
MCHC: 34.2 g/dL (ref 30.0–36.0)
MCV: 93.4 fl (ref 78.0–100.0)
Platelets: 294 10*3/uL (ref 150.0–400.0)
RBC: 4.33 Mil/uL (ref 4.22–5.81)
RDW: 12 % (ref 11.5–15.5)
WBC: 5.5 10*3/uL (ref 4.0–10.5)

## 2018-03-09 LAB — LIPID PANEL
CHOL/HDL RATIO: 2
Cholesterol: 135 mg/dL (ref 0–200)
HDL: 56.1 mg/dL (ref >39.00)
LDL Cholesterol: 68 mg/dL (ref 0–99)
NONHDL: 78.4
Triglycerides: 53 mg/dL (ref 0.0–149.0)
VLDL: 10.6 mg/dL (ref 0.0–40.0)

## 2018-03-09 LAB — COMPREHENSIVE METABOLIC PANEL
ALT: 21 U/L (ref 0–53)
AST: 36 U/L (ref 0–37)
Albumin: 4.5 g/dL (ref 3.5–5.2)
Alkaline Phosphatase: 40 U/L (ref 39–117)
BILIRUBIN TOTAL: 0.8 mg/dL (ref 0.2–1.2)
BUN: 26 mg/dL — AB (ref 6–23)
CO2: 31 meq/L (ref 19–32)
CREATININE: 1.05 mg/dL (ref 0.40–1.50)
Calcium: 9.6 mg/dL (ref 8.4–10.5)
Chloride: 100 mEq/L (ref 96–112)
GFR: 87 mL/min (ref >60.00)
GLUCOSE: 86 mg/dL (ref 70–99)
Potassium: 4 mEq/L (ref 3.5–5.1)
Sodium: 138 mEq/L (ref 135–145)
Total Protein: 7 g/dL (ref 6.0–8.3)

## 2018-03-09 NOTE — Patient Instructions (Addendum)
It was very nice to see you today!  Keep up the good work!  We will check blood work today.  Come back in 1 year for your physical or sooner as needed.   Take care, Dr [name]   Preventive Care 32-39 Years, Male Preventive care refers to lifestyle choices and visits with your health care provider that can promote health and wellness. What does preventive care include?  A yearly physical exam. This is also called an annual well check.  Dental exams once or twice a year.  Routine eye exams. Ask your health care provider how often you should have your eyes checked.  Personal lifestyle choices, including: ? Daily care of your teeth and gums. ? Regular physical activity. ? Eating a healthy diet. ? Avoiding tobacco and drug use. ? Limiting alcohol use. ? Practicing safe sex. What happens during an annual well check? The services and screenings done by your health care provider during your annual well check will depend on your age, overall health, lifestyle risk factors, and family history of disease. Counseling Your health care provider may ask you questions about your:  Alcohol use.  Tobacco use.  Drug use.  Emotional well-being.  Home and relationship well-being.  Sexual activity.  Eating habits.  Work and work Statistician.  Screening You may have the following tests or measurements:  Height, weight, and BMI.  Blood pressure.  Lipid and cholesterol levels. These may be checked every 5 years starting at age 77.  Diabetes screening. This is done by checking your blood sugar (glucose) after you have not eaten for a while (fasting).  Skin check.  Hepatitis C blood test.  Hepatitis B blood test.  Sexually transmitted disease (STD) testing.  Discuss your test results, treatment options, and if necessary, the need for more tests with your health care provider. Vaccines Your health care provider may recommend certain vaccines, such as:  Influenza vaccine.  This is recommended every year.  Tetanus, diphtheria, and acellular pertussis (Tdap, Td) vaccine. You may need a Td booster every 10 years.  Varicella vaccine. You may need this if you have not been vaccinated.  HPV vaccine. If you are 68 or younger, you may need three doses over 6 months.  Measles, mumps, and rubella (MMR) vaccine. You may need at least one dose of MMR.You may also need a second dose.  Pneumococcal 13-valent conjugate (PCV13) vaccine. You may need this if you have certain conditions and have not been vaccinated.  Pneumococcal polysaccharide (PPSV23) vaccine. You may need one or two doses if you smoke cigarettes or if you have certain conditions.  Meningococcal vaccine. One dose is recommended if you are age 22-21 years and a first-year college student living in a residence hall, or if you have one of several medical conditions. You may also need additional booster doses.  Hepatitis A vaccine. You may need this if you have certain conditions or if you travel or work in places where you may be exposed to hepatitis A.  Hepatitis B vaccine. You may need this if you have certain conditions or if you travel or work in places where you may be exposed to hepatitis B.  Haemophilus influenzae type b (Hib) vaccine. You may need this if you have certain risk factors.  Talk to your health care provider about which screenings and vaccines you need and how often you need them. This information is not intended to replace advice given to you by your health care provider. Make sure you  discuss any questions you have with your health care provider. Document Released: 07/06/2001 Document Revised: 01/28/2016 Document Reviewed: 03/11/2015 Elsevier Interactive Patient Education  [employer].

## 2018-03-09 NOTE — Progress Notes (Signed)
Subjective:  Jeffery Le is a 32 y.o. male who presents today for his annual comprehensive physical exam.    HPI:  He has no acute complaints today.   His has been noted with a chronic cough for the past month.  This finally seems to be improving.  Depression screen PHQ 2/9 03/09/2018  Decreased Interest 0  Down, Depressed, Hopeless 0  PHQ - 2 Score 0   ROS: Per HPI, otherwise a complete review of systems was negative.   PMH:  The following were reviewed and entered/updated in epic: History reviewed. No pertinent past medical history. Patient Active Problem List   Diagnosis Date Noted  . Cough 03/09/2018  . Right knee pain 12/28/2016  . Gait disturbance 12/28/2016   Past Surgical History:  Procedure Laterality Date  . ear tues Bilateral     Family History  Problem Relation Age of Onset  . Diabetes Other        Paternal side  . Alzheimer's disease Other        Maternal side  . Hyperlipidemia Father     Medications- reviewed and updated Current Outpatient Medications  Medication Sig Dispense Refill  . budesonide-formoterol (SYMBICORT) 160-4.5 MCG/ACT inhaler Inhale 1 puff into the lungs 2 (two) times daily. 1 Inhaler 0  . Multiple Vitamin (MULTIVITAMIN WITH MINERALS) TABS Take 1 tablet by mouth daily.    . naproxen sodium (ALEVE) 220 MG tablet Take 220 mg by mouth.     No current facility-administered medications for this visit.     Allergies-reviewed and updated Allergies  Allergen Reactions  . Ceclor [Cefaclor]     Unknown   . Cephalosporins Other (See Comments)    unknown    Social History   Socioeconomic History  . Marital status: Single    Spouse name: Not on file  . Number of children: 0  . Years of education: Not on file  . Highest education level: Not on file  Occupational History  . Occupation: Lexicographer  . Financial resource strain: Not on file  . Food insecurity:    Worry: Not on file    Inability: Not on file    . Transportation needs:    Medical: Not on file    Non-medical: Not on file  Tobacco Use  . Smoking status: Former Smoker    Last attempt to quit: 2008    Years since quitting: 11.8  . Smokeless tobacco: Current User    Types: Chew  Substance and Sexual Activity  . Alcohol use: Yes  . Drug use: No  . Sexual activity: Yes    Partners: Female  Lifestyle  . Physical activity:    Days per week: Not on file    Minutes per session: Not on file  . Stress: Not on file  Relationships  . Social connections:    Talks on phone: Not on file    Gets together: Not on file    Attends religious service: Not on file    Active member of club or organization: Not on file    Attends meetings of clubs or organizations: Not on file    Relationship status: Not on file  Other Topics Concern  . Not on file  Social History Narrative  . Not on file    Objective:  Physical Exam: BP 124/74 (BP Location: Left Arm, Patient Position: Sitting, Cuff Size: Normal)   Pulse 71   Temp 98.1 F (36.7 C) (Oral)   Ht 5'  9" (1.753 m)   Wt 155 lb 12.8 oz (70.7 kg)   SpO2 96%   BMI 23.01 kg/m   Body mass index is 23.01 kg/m. Wt Readings from Last 3 Encounters:  03/09/18 155 lb 12.8 oz (70.7 kg)  02/15/18 157 lb (71.2 kg)  02/10/18 155 lb 8 oz (70.5 kg)   Gen: NAD, resting comfortably HEENT: TMs normal bilaterally. OP clear. No thyromegaly noted.  CV: RRR with no murmurs appreciated Pulm: NWOB, CTAB with no crackles, wheezes, or rhonchi GI: Normal bowel sounds present. Soft, Nontender, Nondistended. MSK: no edema, cyanosis, or clubbing noted Skin: warm, dry Neuro: CN2-12 grossly intact. Strength 5/5 in upper and lower extremities. Reflexes symmetric and intact bilaterally.  Psych: Normal affect and thought content  Assessment/Plan:  Cough Likely post infectious cough.  Exam clear today and he has no red flag signs or symptoms.  Check CBC and CMET.  Continue with watchful waiting.  If cough  persist, will need advanced imaging and/referral to pulmonology.  Preventative Healthcare: Patient will return soon for flu shot.  Check lipid panel today.  Patient Counseling(The following topics were reviewed and/or handout was given):  -Nutrition: Stressed importance of moderation in sodium/caffeine intake, saturated fat and cholesterol, caloric balance, sufficient intake of fresh fruits, vegetables, and fiber.  -Stressed the importance of regular exercise.   -Substance Abuse: Discussed cessation/primary prevention of tobacco, alcohol, or other drug use; driving or other dangerous activities under the influence; availability of treatment for abuse.   -Injury prevention: Discussed safety belts, safety helmets, smoke detector, smoking near bedding or upholstery.   -Sexuality: Discussed sexually transmitted diseases, partner selection, use of condoms, avoidance of unintended pregnancy and contraceptive alternatives.   -Dental health: Discussed importance of regular tooth brushing, flossing, and dental visits.  -Health maintenance and immunizations reviewed. Please refer to Health maintenance section.  Return to care in 1 year for next preventative visit.   [name]. [name], MD 03/09/2018 9:22 AM

## 2018-03-09 NOTE — Assessment & Plan Note (Signed)
Likely post infectious cough.  Exam clear today and he has no red flag signs or symptoms.  Check CBC and CMET.  Continue with watchful waiting.  If cough persist, will need advanced imaging and/referral to pulmonology.

## 2018-03-10 NOTE — Progress Notes (Signed)
Dr Lavone Neri interpretation of your lab work:  All of your labs are NORMAL. Keep up the good work! We can recheck again next year.   If you have any additional questions, please give [place] a call or send [place] a message through [place].  Take care, Dr [name]

## 2018-05-15 ENCOUNTER — Encounter: Payer: Self-pay | Admitting: Family Medicine

## 2018-05-15 ENCOUNTER — Ambulatory Visit (INDEPENDENT_AMBULATORY_CARE_PROVIDER_SITE_OTHER): Payer: BLUE CROSS/BLUE SHIELD | Admitting: Family Medicine

## 2018-05-15 VITALS — BP 120/72 | HR 74 | Temp 98.1°F | Ht 69.0 in | Wt 158.2 lb

## 2018-05-15 DIAGNOSIS — R059 Cough, unspecified: Secondary | ICD-10-CM

## 2018-05-15 DIAGNOSIS — R05 Cough: Secondary | ICD-10-CM

## 2018-05-15 DIAGNOSIS — J310 Chronic rhinitis: Secondary | ICD-10-CM

## 2018-05-15 NOTE — Progress Notes (Signed)
   Subjective:  Jeffery Le is a 32 y.o. male who presents today for same-day appointment with a chief complaint of cough.   HPI:  Cough, Acute problem Started 5 days ago. Worsened over that time. Feeling better today. Worse in the morning.  He has had occasional sputum production.  He has been rinsing aggressively with mouthwash and has noticed some blood coming from the back of his throat a few times.  Had a slight fever a few days ago which is since resolved.  No other treatments tried.  Neysa BonitoFianc has been sick with similar symptoms.  ROS: Per HPI  Objective:  Physical Exam: BP 120/72 (BP Location: Right Arm, Patient Position: Sitting, Cuff Size: Normal)   Pulse 74   Temp 98.1 F (36.7 C) (Oral)   Ht 5\' 9"  (1.753 m)   Wt 158 lb 3.2 oz (71.8 kg)   SpO2 97%   BMI 23.36 kg/m   Gen: NAD, resting comfortably HEENT: TMs clear.  OP erythematous with no lesions or exudate.  Neck circumference slightly erythematous and boggy bilaterally. CV: RRR with no murmurs appreciated Pulm: NWOB, CTAB with no crackles, wheezes, or rhonchi GI: Normal bowel sounds present. Soft, Nontender, Nondistended.  Assessment/Plan:  Cough/Rhinitis Patient has several signs of upper respiratory infection which I think is the main contributor for his symptoms.  Pulmonary exam is normal-no signs of bronchitis or pneumonia. He had a long bout of post-infectious cough last year. Patient does not want aggressive therapy at this point.  Recommended using over-the-counter nasal spray or sinus rinses as needed.  Recommended good oral hydration.  Discussed reasons to return to care.  Follow-up as needed.  Katina Degreealeb M. Jimmey RalphParker, MD 05/15/2018 3:39 PM

## 2018-05-15 NOTE — Patient Instructions (Signed)
It was very nice to see you today!  Your cough is coming from your nasal passageways and your sinuses.  You do not have any signs of bronchitis or pneumonia.  Please try using sinus rinse or Mucinex.  Please let me know if you would like for me to send in a prescription nasal spray.  Let me know if your symptoms worsen or not continue to improve over the next few days.  Take care, Dr Jimmey RalphParker

## 2018-05-28 DIAGNOSIS — R6889 Other general symptoms and signs: Secondary | ICD-10-CM | POA: Diagnosis not present

## 2018-05-28 DIAGNOSIS — J028 Acute pharyngitis due to other specified organisms: Secondary | ICD-10-CM | POA: Diagnosis not present

## 2018-08-09 ENCOUNTER — Ambulatory Visit: Payer: Self-pay

## 2018-08-09 NOTE — Telephone Encounter (Signed)
Patient called, left VM to return call to the office.  Summary: symptoms   Pt is having lingering cough for 2 weeks. He thinks he had pneumonia mid February ( not diagnosed with it) no fever.no sob. He wants to make sure his symptoms are not corona.

## 2018-08-09 NOTE — Telephone Encounter (Signed)
Patient called in with c/o cough. He says he thinks he had pneumonia in February that wasn't treated, the cough got better the beginning of March, then came back a couple of weeks ago. He says he's not coughing up anything at this time. He denies SOB, fever, runny nose, chest pain. 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 He says he hasn't been exposed to anyone with COVID-19, that he's aware of. According to protocol, see PCP within 3 days, appointment scheduled for tomorrow at 1340 with Dr. [name], care advice given, patient verbalized understanding.  Reason for Disposition . Cough has been present for > 3 weeks  Answer Assessment - Initial Assessment Questions 1. ONSET: "When did the cough begin?"      Lingering cough since February 2. SEVERITY: "How bad is the cough today?"      Comes and goes, not that bad today 3. RESPIRATORY DISTRESS: "Describe your breathing."      No 4. FEVER: "Do you have a fever?" If so, ask: "What is your temperature, how was it measured, and when did it start?"     No 5. HEMOPTYSIS: "Are you coughing up any blood?" If so ask: "How much?" (flecks, streaks, tablespoons, etc.)     No 6. TREATMENT: "What have you done so far to treat the cough?" (e.g., meds, fluids, humidifier)     Nothing other than airborne and zinc, vitamin C 7. CARDIAC HISTORY: "Do you have any history of heart disease?" (e.g., heart attack, congestive heart failure)      No 8. LUNG HISTORY: "Do you have any history of lung disease?"  (e.g., pulmonary embolus, asthma, emphysema)     No 9. PE RISK FACTORS: "Do you have a history of blood clots?" (or: recent major surgery, recent prolonged travel, bedridden)     Yes prolonged travel 10. OTHER SYMPTOMS: "Do you have any other symptoms? (e.g., runny nose, wheezing, chest pain)       No 11. PREGNANCY: "Is there any chance you are pregnant?" "When was your  last menstrual period?"       N/A 12. TRAVEL: "Have you traveled out of the country in the last month?" (e.g., travel history, exposures)       No travel out of country; airports all over the country, but not the high risk airports  Protocols used: COUGH - ACUTE NON-PRODUCTIVE-A-AH

## 2018-08-10 ENCOUNTER — Encounter: Payer: Self-pay | Admitting: Family Medicine

## 2018-08-10 ENCOUNTER — Other Ambulatory Visit: Payer: Self-pay

## 2018-08-10 ENCOUNTER — Ambulatory Visit: Payer: BLUE CROSS/BLUE SHIELD | Admitting: Family Medicine

## 2018-08-10 ENCOUNTER — Ambulatory Visit (INDEPENDENT_AMBULATORY_CARE_PROVIDER_SITE_OTHER): Payer: BLUE CROSS/BLUE SHIELD | Admitting: Family Medicine

## 2018-08-10 VITALS — BP 128/74 | HR 78 | Temp 98.3°F | Ht 69.0 in | Wt 156.6 lb

## 2018-08-10 DIAGNOSIS — R059 Cough, unspecified: Secondary | ICD-10-CM

## 2018-08-10 DIAGNOSIS — R05 Cough: Secondary | ICD-10-CM | POA: Diagnosis not present

## 2018-08-10 DIAGNOSIS — J3489 Other specified disorders of nose and nasal sinuses: Secondary | ICD-10-CM

## 2018-08-10 NOTE — Progress Notes (Signed)
   Chief Complaint:  Jeffery Le is a 33 y.o. male who presents for same day appointment with a chief complaint of cough.   Assessment/Plan:  Cough / Rhinorrhea No red flags.  Low risk for COVID19 -does not need testing.  Symptoms likely secondary to seasonal allergies.  Recommended Flonase as needed.  Discussed reasons to return to care.  Follow-up as needed.     Subjective:  HPI:  Cough, acute problem Started several weeks ago. No shortness of breath, fever, or chest pain. 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  No treatments tried.  Cough is intermittent.  Does have associated rhinorrhea. No other obvious alleviating or aggravating factors.    ROS: Per HPI  PMH: He reports that he quit smoking about 12 years ago. His smokeless tobacco use includes chew. He reports current alcohol use. He reports that he does not use drugs.      Objective:  Physical Exam: BP 128/74 (BP Location: Left Arm, Patient Position: Sitting, Cuff Size: Normal)   Pulse 78   Temp 98.3 F (36.8 C) (Oral)   Ht 5\' 9"  (1.753 m)   Wt 156 lb 9.6 oz (71 kg)   SpO2 97%   BMI 23.13 kg/m   Gen: NAD, resting comfortably HEENT: TMs with clear effusion.  OP erythematous.  Nasal mucosa erythematous and boggy. CV: Regular rate and rhythm with no murmurs appreciated Pulm: Normal work of breathing, clear to auscultation bilaterally with no crackles, wheezes, or rhonchi     Shaylin Blatt M. [name], MD 08/10/2018 2:19 PM

## 2018-08-10 NOTE — Patient Instructions (Signed)
It was very nice to see you today!  I think you have allergies.   Please try using flonase.  Let me know if your symptoms do not improve.  Take care, Dr [name]

## 2018-12-20 DIAGNOSIS — D2261 Melanocytic nevi of right upper limb, including shoulder: Secondary | ICD-10-CM | POA: Diagnosis not present

## 2018-12-20 DIAGNOSIS — D225 Melanocytic nevi of trunk: Secondary | ICD-10-CM | POA: Diagnosis not present

## 2018-12-20 DIAGNOSIS — L814 Other melanin hyperpigmentation: Secondary | ICD-10-CM | POA: Diagnosis not present

## 2018-12-20 DIAGNOSIS — D2262 Melanocytic nevi of left upper limb, including shoulder: Secondary | ICD-10-CM | POA: Diagnosis not present

## 2019-05-24 ENCOUNTER — Ambulatory Visit: Payer: BC Managed Care – PPO

## 2019-05-24 ENCOUNTER — Ambulatory Visit (INDEPENDENT_AMBULATORY_CARE_PROVIDER_SITE_OTHER): Payer: BC Managed Care – PPO

## 2019-05-24 ENCOUNTER — Ambulatory Visit (INDEPENDENT_AMBULATORY_CARE_PROVIDER_SITE_OTHER): Payer: BC Managed Care – PPO | Admitting: Physician Assistant

## 2019-05-24 ENCOUNTER — Encounter: Payer: Self-pay | Admitting: Physician Assistant

## 2019-05-24 ENCOUNTER — Other Ambulatory Visit: Payer: Self-pay

## 2019-05-24 VITALS — BP 136/70 | HR 75 | Temp 97.9°F | Ht 69.0 in | Wt 157.2 lb

## 2019-05-24 DIAGNOSIS — M79632 Pain in left forearm: Secondary | ICD-10-CM | POA: Diagnosis not present

## 2019-05-24 DIAGNOSIS — M79602 Pain in left arm: Secondary | ICD-10-CM

## 2019-05-24 DIAGNOSIS — R05 Cough: Secondary | ICD-10-CM | POA: Diagnosis not present

## 2019-05-24 DIAGNOSIS — M7989 Other specified soft tissue disorders: Secondary | ICD-10-CM | POA: Diagnosis not present

## 2019-05-24 DIAGNOSIS — R059 Cough, unspecified: Secondary | ICD-10-CM

## 2019-05-24 NOTE — Patient Instructions (Signed)
It was great to see you!  Try to rest your arm if you can. Continue ibuprofen regularly for a few days to help with swelling. If after another week it doesn't have improvement, or if it gets worse, please let me know and we will likely get you over the sports medicine doctor. I will be in touch with the radiology read.  You will be contacted about your referral to pulmonary.  Take care,  Inda Coke PA-C

## 2019-05-24 NOTE — Progress Notes (Signed)
Jeffery Le is a 33 y.o. male here for a new problem.  I acted as a Neurosurgeon for [employer], PA-C Jeffery Mull, LPN  History of Present Illness:   Chief Complaint  Patient presents with  . Arm strain    HPI   Arm strain Pt c/o Rt forearm strain x 5 days, was working in yard raking leaves and then next day woke up and was sore.  He states that he has been also doing packing and lifting as he is preparing to sell a townhouse.  Rt wrist and forearm painful and swollen. He took ibuprofen this morning and iced it last night. He is wearing a compression sleeve now.  Denies: Numbness, tingling, weakness, lacerations.  He does not feel like he had any clicking popping or sudden sharp pain when doing any yard work.  Cough Patient reports that he has had a chronic cough and he cannot quite figure out what triggers it.  He has had multiple negative Covid test.  He does not have any GERD.  He was given Symbicort at 1 point for cough, but has not used that recently for this.  Denies any cough or difficulty breathing while exercising or exerting himself.  Denies chest tightness or chest pain.  Sometimes when he goes to take a deep breath he feels like he has to cough.  Denies any personal known history of asthma.    History reviewed. No pertinent past medical history.   Social History   Socioeconomic History  . Marital status: Single    Spouse name: Not on file  . Number of children: 0  . Years of education: Not on file  . Highest education level: Not on file  Occupational History  . Occupation: Occupational hygienist  Tobacco Use  . Smoking status: Former Smoker    Quit date: 2008    Years since quitting: 13.0  . Smokeless tobacco: Current User    Types: Chew  Substance and Sexual Activity  . Alcohol use: Yes  . Drug use: No  . Sexual activity: Yes    Partners: Female  Other Topics Concern  . Not on file  Social History Narrative  . Not on file   Social Determinants of Health    Financial Resource Strain:   . Difficulty of Paying Living Expenses: Not on file  Food Insecurity:   . Worried About Programme researcher, broadcasting/film/video in the Last Year: Not on file  . Ran Out of Food in the Last Year: Not on file  Transportation Needs:   . Lack of Transportation (Medical): Not on file  . Lack of Transportation (Non-Medical): Not on file  Physical Activity:   . Days of Exercise per Week: Not on file  . Minutes of Exercise per Session: Not on file  Stress:   . Feeling of Stress : Not on file  Social Connections:   . Frequency of Communication with Friends and Family: Not on file  . Frequency of Social Gatherings with Friends and Family: Not on file  . Attends Religious Services: Not on file  . Active Member of Clubs or Organizations: Not on file  . Attends Banker Meetings: Not on file  . Marital Status: Not on file  Intimate Partner Violence:   . Fear of Current or Ex-Partner: Not on file  . Emotionally Abused: Not on file  . Physically Abused: Not on file  . Sexually Abused: Not on file    Past Surgical History:  Procedure  Laterality Date  . ear tues Bilateral     Family History  Problem Relation Age of Onset  . Diabetes Other        Paternal side  . Alzheimer's disease Other        Maternal side  . Hyperlipidemia Father     Allergies  Allergen Reactions  . Ceclor [Cefaclor]     Unknown   . Cephalosporins Other (See Comments)    unknown    Current Medications:   Current Outpatient Medications:  Marland Kitchen  Multiple Vitamin (MULTIVITAMIN WITH MINERALS) TABS, Take 1 tablet by mouth daily., Disp: , Rfl:  .  naproxen sodium (ALEVE) 220 MG tablet, Take 220 mg by mouth., Disp: , Rfl:    Review of Systems:   ROS  Negative unless otherwise specified per HPI.  Vitals:   Vitals:   05/24/19 1051  BP: 136/70  Pulse: 75  Temp: 97.9 F (36.6 C)  TempSrc: Temporal  SpO2: 95%  Weight: 157 lb 4 oz (71.3 kg)  Height: 5\' 9"  (1.753 m)     Body mass  index is 23.22 kg/m.  Physical Exam:   Physical Exam Vitals and nursing note reviewed.  Constitutional:      General: He is not in acute distress.    Appearance: He is well-developed. He is not ill-appearing or toxic-appearing.  Cardiovascular:     Rate and Rhythm: Normal rate and regular rhythm.     Pulses: Normal pulses.     Heart sounds: Normal heart sounds, S1 normal and S2 normal.     Comments: No LE edema Pulmonary:     Effort: Pulmonary effort is normal.     Breath sounds: Normal breath sounds.  Musculoskeletal:     Comments: Left Wrist/Forearm: L lateral forearm with mild swelling and slight ecchymosis, mild TTP Grip strength intact Pain elicited to forearm with flexion and extension of L wrist No anatomical snuffbox tenderness  Skin:    General: Skin is warm and dry.  Neurological:     Mental Status: He is alert.     GCS: GCS eye subscore is 4. GCS verbal subscore is 5. GCS motor subscore is 6.  Psychiatric:        Speech: Speech normal.        Behavior: Behavior normal. Behavior is cooperative.     Assessment and Plan:   Jeffery Le was seen today for arm strain.  Diagnoses and all orders for this visit:  Left arm pain No bony abnormalities seen on my read; awaiting radiology read. Worsening precautions advised. Recommended that patient continue to take oral ibuprofen for the next few days. If no improvement, or if any worsening, will recommend referral to Dr. Georgina Snell. Discussed thumb spica splint for immoblizaation -- he states that he will go to [facility] to look for one. -     DG Forearm Left; Future  Cough No red flags on discussion. Possible Upper Airway Cough Syndrome, however will defer to pulmonology for further work-up and evaluation. -     [facility]   . Reviewed expectations re: course of current medical issues. . Discussed self-management of symptoms. . Outlined signs and symptoms indicating need for more acute intervention. . Patient verbalized  understanding and all questions were answered. . See orders for this visit as documented in the electronic medical record. . Patient received an After-Visit Summary.  CMA or LPN served as scribe during this visit. History, Physical, and Plan performed by medical provider. The above documentation has been  reviewed and is accurate and complete.  Jarold MottoSamantha Cheo Selvey, PA-C

## 2019-12-27 ENCOUNTER — Encounter: Payer: BC Managed Care – PPO | Admitting: Family Medicine

## 2020-01-01 IMAGING — DX DG FOREARM 2V*L*
2 series · 2 of 2 positions shown · non-contrast
Comparison: None.

CLINICAL DATA: Left forearm pain and swelling for 5 days.

EXAM:
LEFT FOREARM - 2 VIEW

[forearm ap]
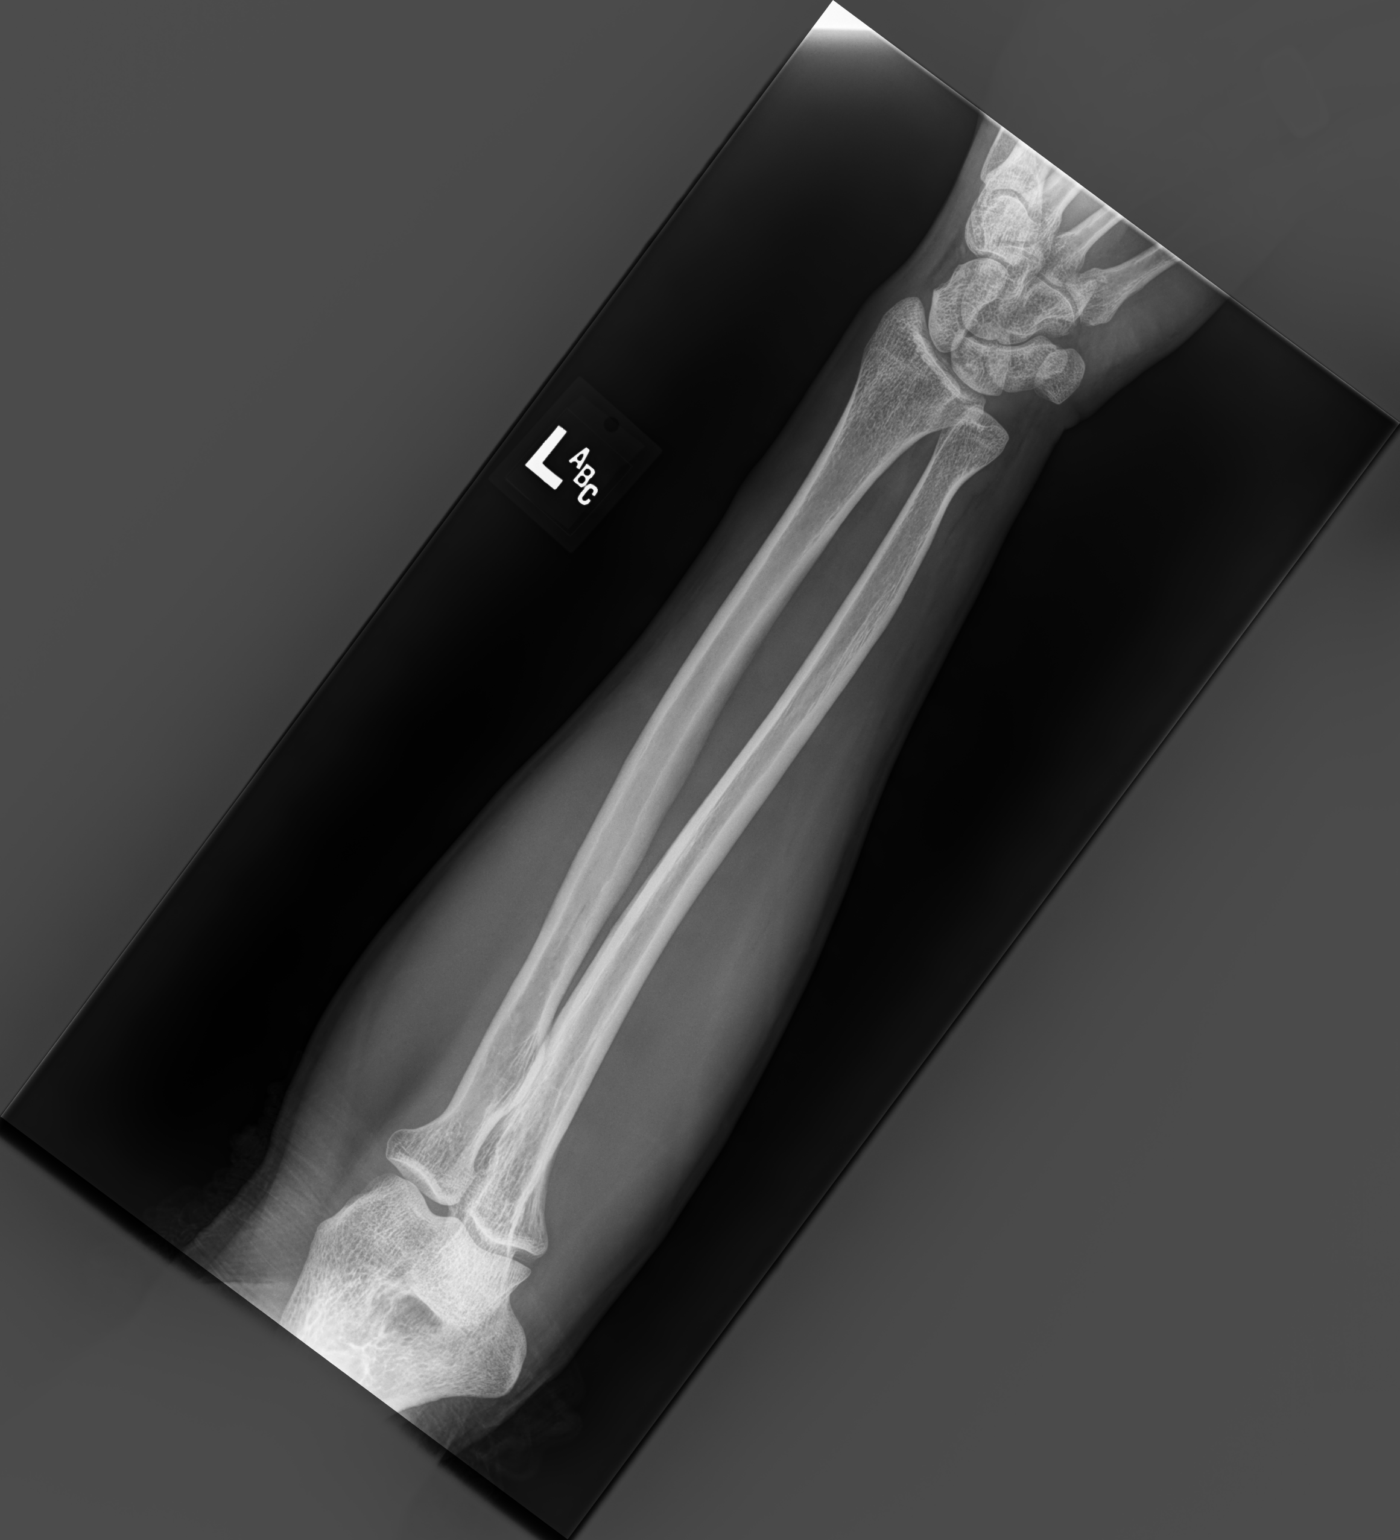

[forearm lat]
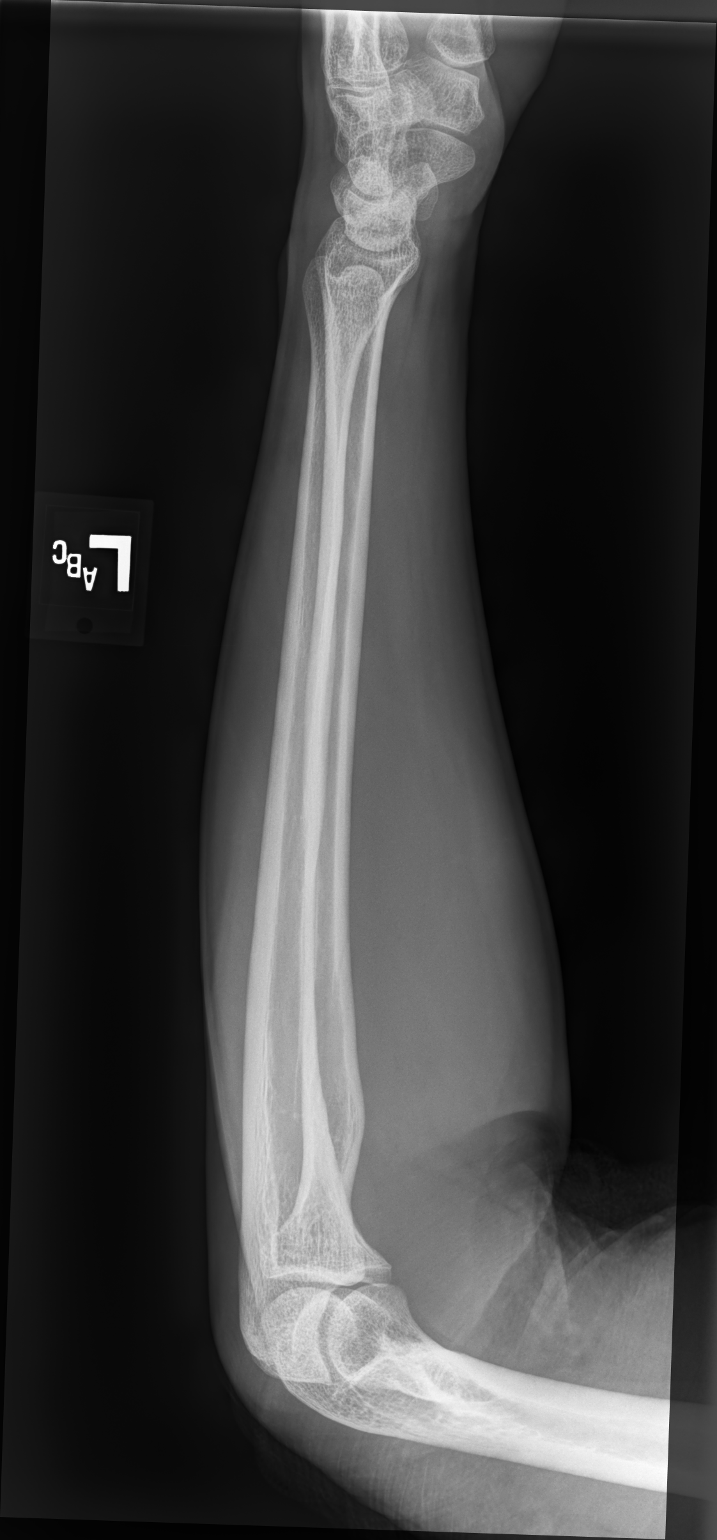

[2 of 2 positions shown; findings below may reference images not displayed]

FINDINGS: There is no evidence of fracture or other focal bone lesions. Soft
tissues are unremarkable.
IMPRESSION: Negative.

## 2020-01-03 ENCOUNTER — Encounter: Payer: BC Managed Care – PPO | Admitting: Family Medicine

## 2020-01-23 ENCOUNTER — Ambulatory Visit (INDEPENDENT_AMBULATORY_CARE_PROVIDER_SITE_OTHER): Payer: BC Managed Care – PPO | Admitting: Family Medicine

## 2020-01-23 ENCOUNTER — Other Ambulatory Visit: Payer: Self-pay

## 2020-01-23 VITALS — BP 128/74 | HR 62 | Temp 98.6°F | Ht 69.0 in | Wt 156.0 lb

## 2020-01-23 DIAGNOSIS — S39011A Strain of muscle, fascia and tendon of abdomen, initial encounter: Secondary | ICD-10-CM | POA: Diagnosis not present

## 2020-01-23 NOTE — Patient Instructions (Signed)
It was very nice to see you today!  You have an abdominal wall strain.  Please take it easy for the next couple weeks.  You can take Tylenol or ibuprofen as needed.  Heating pad should also help.  Take care, Dr [name]  Please try these tips to maintain a healthy lifestyle:   Eat at least 3 REAL meals and 1-2 snacks per day.  Aim for no more than 5 hours between eating.  If you eat breakfast, please do so within one hour of getting up.    Each meal should contain half fruits/vegetables, one quarter protein, and one quarter carbs (no bigger than a computer mouse)   Cut down on sweet beverages. This includes juice, soda, and sweet tea.     Drink at least 1 glass of water with each meal and aim for at least 8 glasses per day   Exercise at least 150 minutes every week.

## 2020-01-23 NOTE — Progress Notes (Signed)
   Jeffery Le is a 34 y.o. male who presents today for an office visit.  Assessment/Plan:  New/Acute Problems: Abdominal wall strain No red flags.  Pain reproducible on exam.  Low suspicion for kidney stones or intra-abdominal pathology.  Discussed home exercises and handout was given.  Anticipate improvement over the next few weeks.  He can use over-the-counter anti-inflammatories as needed.  Discussed reasons return to care.    Subjective:  HPI:  Symptoms started 3 days ago after he has been much more active with work.  Had some acheyness in left shoulder and right lower back. Over the last couple of days has some soreness in his right flank. Pain feels like a sharp pain. No substernal chest pain. No rashes. No fevers or chills. No nausea or vomiting.       Objective:  Physical Exam: BP 128/74   Pulse 62   Temp 98.6 F (37 C) (Temporal)   Ht 5\' 9"  (1.753 m)   Wt 156 lb (70.8 kg)   SpO2 98%   BMI 23.04 kg/m   Gen: No acute distress, resting comfortably GI: Soft, nondistended.  Tenderness along right flank along oblique abdominal muscles.  Murphy sign negative. MSK: Back without deformities.  Nontender to palpation. Neuro: Grossly normal, moves all extremities Psych: Normal affect and thought content      Jeffery Frede M. , MD 01/23/2020 11:08 AM

## 2020-02-22 ENCOUNTER — Encounter: Payer: BC Managed Care – PPO | Admitting: Family Medicine

## 2020-02-25 ENCOUNTER — Encounter: Payer: BC Managed Care – PPO | Admitting: Family Medicine

## 2020-02-26 ENCOUNTER — Encounter: Payer: Self-pay | Admitting: Family Medicine

## 2020-02-26 ENCOUNTER — Telehealth (INDEPENDENT_AMBULATORY_CARE_PROVIDER_SITE_OTHER): Payer: BC Managed Care – PPO | Admitting: Family Medicine

## 2020-02-26 ENCOUNTER — Telehealth: Payer: Self-pay

## 2020-02-26 VITALS — Temp 97.9°F | Ht 69.0 in | Wt 155.0 lb

## 2020-02-26 DIAGNOSIS — J029 Acute pharyngitis, unspecified: Secondary | ICD-10-CM

## 2020-02-26 NOTE — Telephone Encounter (Signed)
disregard

## 2020-02-26 NOTE — Progress Notes (Signed)
   Jeffery Le is a 34 y.o. male who presents today for a virtual office visit.  Assessment/Plan:  New/Acute Problems: Sore Throat Likely benign immunogenic response to Covid vaccine yesterday.  Could represent URI though given timing of vaccine yesterday doubt that this is the case.  Encourage good oral hydration.  Can use over-the-counter meds if needed.  If symptoms do not improve the next day or 2 will consider testing.  Discussed reasons to return to care.  Follow-up as needed.    Subjective:  HPI:  Patient received first dose of Covid vaccine yesterday.  Today has had more ache, sore throat, postnasal drip.  Has also had some lymphadenopathy.  He is not sure if he is sick or if this is a reaction to the vaccine.  No specific treatments tried.       Objective/Observations  Physical Exam: Gen: NAD, resting comfortably Pulm: Normal work of breathing Neuro: Grossly normal, moves all extremities Psych: Normal affect and thought content  Virtual Visit via Video   I connected with Jeffery Le on 02/26/20 at  4:00 PM EDT by a video enabled telemedicine application and verified that I am speaking with the correct person using two identifiers. The limitations of evaluation and management by telemedicine and the availability of in person appointments were discussed. The patient expressed understanding and agreed to proceed.   Patient location: Home Provider location: [place] [facility] [employer] Persons participating in the virtual visit: Myself and Patient     [name]. [name], MD 02/26/2020 3:30 PM

## 2020-03-18 ENCOUNTER — Encounter: Payer: BC Managed Care – PPO | Admitting: Family Medicine

## 2020-06-10 ENCOUNTER — Encounter: Payer: Self-pay | Admitting: Family Medicine

## 2020-06-10 ENCOUNTER — Ambulatory Visit (INDEPENDENT_AMBULATORY_CARE_PROVIDER_SITE_OTHER): Payer: BC Managed Care – PPO | Admitting: Family Medicine

## 2020-06-10 ENCOUNTER — Other Ambulatory Visit: Payer: Self-pay

## 2020-06-10 VITALS — BP 123/62 | HR 58 | Temp 98.2°F | Ht 69.0 in | Wt 160.8 lb

## 2020-06-10 DIAGNOSIS — M25511 Pain in right shoulder: Secondary | ICD-10-CM

## 2020-06-10 DIAGNOSIS — F17200 Nicotine dependence, unspecified, uncomplicated: Secondary | ICD-10-CM

## 2020-06-10 DIAGNOSIS — Z23 Encounter for immunization: Secondary | ICD-10-CM

## 2020-06-10 DIAGNOSIS — Z13220 Encounter for screening for lipoid disorders: Secondary | ICD-10-CM | POA: Diagnosis not present

## 2020-06-10 DIAGNOSIS — Z0001 Encounter for general adult medical examination with abnormal findings: Secondary | ICD-10-CM | POA: Diagnosis not present

## 2020-06-10 LAB — COMPREHENSIVE METABOLIC PANEL
ALT: 21 U/L (ref 0–53)
AST: 29 U/L (ref 0–37)
Albumin: 4.8 g/dL (ref 3.5–5.2)
Alkaline Phosphatase: 38 U/L — ABNORMAL LOW (ref 39–117)
BUN: 18 mg/dL (ref 6–23)
CO2: 31 mEq/L (ref 19–32)
Calcium: 9.9 mg/dL (ref 8.4–10.5)
Chloride: 101 mEq/L (ref 96–112)
Creatinine, Ser: 0.96 mg/dL (ref 0.40–1.50)
GFR: 103.31 mL/min (ref >60.00)
Glucose, Bld: 89 mg/dL (ref 70–99)
Potassium: 4.2 mEq/L (ref 3.5–5.1)
Sodium: 137 mEq/L (ref 135–145)
Total Bilirubin: 0.9 mg/dL (ref 0.2–1.2)
Total Protein: 7.1 g/dL (ref 6.0–8.3)

## 2020-06-10 LAB — LIPID PANEL
Cholesterol: 149 mg/dL (ref 0–200)
HDL: 53.6 mg/dL (ref >39.00)
LDL Cholesterol: 85 mg/dL (ref 0–99)
NonHDL: 95.18
Total CHOL/HDL Ratio: 3
Triglycerides: 52 mg/dL (ref 0.0–149.0)
VLDL: 10.4 mg/dL (ref 0.0–40.0)

## 2020-06-10 LAB — TSH: TSH: 0.75 u[IU]/mL (ref 0.35–4.50)

## 2020-06-10 LAB — CBC
HCT: 41.3 % (ref 39.0–52.0)
Hemoglobin: 14 g/dL (ref 13.0–17.0)
MCHC: 33.9 g/dL (ref 30.0–36.0)
MCV: 92.6 fl (ref 78.0–100.0)
Platelets: 305 10*3/uL (ref 150.0–400.0)
RBC: 4.47 Mil/uL (ref 4.22–5.81)
RDW: 12 % (ref 11.5–15.5)
WBC: 6.3 10*3/uL (ref 4.0–10.5)

## 2020-06-10 NOTE — Patient Instructions (Signed)
It was very nice to see you today!  You have inflammation in your rotator cuff.  Please try taking naproxen twice daily for the next 1 to 2 weeks and working on exercises.  Let me know if not improving.  We'll give your flu vaccine and Tdap today.  We'll check blood work today.  I will see back in a year.  Please come back to see me sooner if needed.  Take care, Dr [name]  Please try these tips to maintain a healthy lifestyle:   Eat at least 3 REAL meals and 1-2 snacks per day.  Aim for no more than 5 hours between eating.  If you eat breakfast, please do so within one hour of getting up.    Each meal should contain half fruits/vegetables, one quarter protein, and one quarter carbs (no bigger than a computer mouse)   Cut down on sweet beverages. This includes juice, soda, and sweet tea.     Drink at least 1 glass of water with each meal and aim for at least 8 glasses per day   Exercise at least 150 minutes every week.    Preventive Care 19-77 Years Old, Male Preventive care refers to lifestyle choices and visits with your health care provider that can promote health and wellness. This includes:  A yearly physical exam. This is also called an annual wellness visit.  Regular dental and eye exams.  Immunizations.  Screening for certain conditions.  Healthy lifestyle choices, such as: ? Eating a healthy diet. ? Getting regular exercise. ? Not using drugs or products that contain nicotine and tobacco. ? Limiting alcohol use. What can I expect for my preventive care visit? Physical exam Your health care provider may check your:  Height and weight. These may be used to calculate your BMI (body mass index). BMI is a measurement that tells if you are at a healthy weight.  Heart rate and blood pressure.  Body temperature.  Skin for abnormal spots. Counseling Your health care provider may ask you questions about your:  Past medical problems.  Family's medical  history.  Alcohol, tobacco, and drug use.  Emotional well-being.  Home life and relationship well-being.  Sexual activity.  Diet, exercise, and sleep habits.  Work and work Astronomer.  Access to firearms. What immunizations do I need? Vaccines are usually given at various ages, according to a schedule. Your health care provider will recommend vaccines for you based on your age, medical history, and lifestyle or other factors, such as travel or where you work.   What tests do I need? Blood tests  Lipid and cholesterol levels. These may be checked every 5 years starting at age 67.  Hepatitis C test.  Hepatitis B test. Screening  Diabetes screening. This is done by checking your blood sugar (glucose) after you have not eaten for a while (fasting).  Genital exam to check for testicular cancer or hernias.  STD (sexually transmitted disease) testing, if you are at risk. Talk with your health care provider about your test results, treatment options, and if necessary, the need for more tests.   Follow these instructions at home: Eating and drinking  Eat a healthy diet that includes fresh fruits and vegetables, whole grains, lean protein, and low-fat dairy products.  Drink enough fluid to keep your urine pale yellow.  Take vitamin and mineral supplements as recommended by your health care provider.  Do not drink alcohol if your health care provider tells you not to drink.  If you drink alcohol: ? Limit how much you have to 0-2 drinks a day. ? Be aware of how much alcohol is in your drink. In the [place] one drink equals one 12 oz bottle of beer (355 mL), one 5 oz glass of wine (148 mL), or one 1 oz glass of hard liquor (44 mL).   Lifestyle  Take daily care of your teeth and gums. Brush your teeth every morning and night with fluoride toothpaste. Floss one time each day.  Stay active. Exercise for at least 30 minutes 5 or more days each week.  Do not use any products that  contain nicotine or tobacco, such as cigarettes, e-cigarettes, and chewing tobacco. If you need help quitting, ask your health care provider.  Do not use drugs.  If you are sexually active, practice safe sex. Use a condom or other form of protection to prevent STIs (sexually transmitted infections).  Find healthy ways to cope with stress, such as: ? Meditation, yoga, or listening to music. ? Journaling. ? Talking to a trusted person. ? Spending time with friends and family. Safety  Always wear your seat belt while driving or riding in a vehicle.  Do not drive: ? If you have been drinking alcohol. Do not ride with someone who has been drinking. ? When you are tired or distracted. ? While texting.  Wear a helmet and other protective equipment during sports activities.  If you have firearms in your house, make sure you follow all gun safety procedures.  Seek help if you have been physically or sexually abused. What's next?  Go to your health care provider once a year for an annual wellness visit.  Ask your health care provider how often you should have your eyes and teeth checked.  Stay up to date on all vaccines. This information is not intended to replace advice given to you by your health care provider. Make sure you discuss any questions you have with your health care provider. Document Revised: 01/24/2019 Document Reviewed: 05/04/2018 Elsevier Patient Education  2021 [employer].

## 2020-06-10 NOTE — Progress Notes (Signed)
Chief Complaint:  Jeffery Le is a 35 y.o. male who presents today for his annual comprehensive physical exam.    Assessment/Plan:  Shoulder Pain Exam consistent with rotator cuff tendinopathy.  Discussed home exercises and handout was given.  He will try naproxen let me know if not improving.  Discussed reasons to return to care.  Preventative Healthcare: Tdap and flu vaccine given today.  Check labs.  Up-to-date on other preventive healthcare measures.  Patient Counseling(The following topics were reviewed and/or handout was given):  -Nutrition: Stressed importance of moderation in sodium/caffeine intake, saturated fat and cholesterol, caloric balance, sufficient intake of fresh fruits, vegetables, and fiber.  -Stressed the importance of regular exercise.   -Substance Abuse: Discussed cessation/primary prevention of tobacco, alcohol, or other drug use; driving or other dangerous activities under the influence; availability of treatment for abuse.   -Injury prevention: Discussed safety belts, safety helmets, smoke detector, smoking near bedding or upholstery.   -Sexuality: Discussed sexually transmitted diseases, partner selection, use of condoms, avoidance of unintended pregnancy and contraceptive alternatives.   -Dental health: Discussed importance of regular tooth brushing, flossing, and dental visits.  -Health maintenance and immunizations reviewed. Please refer to Health maintenance section.  Return to care in 1 year for next preventative visit.     Subjective:  HPI:  He has no acute complaints today.  He has been having issues with right shoulder pain for last few weeks.  Worse with certain motions over his head.  No obvious injuries or precipitating events.  Lifestyle Diet: Balanced. Plenty of fruits and vegetables.  Exercise: 5-6 times weekly.   Depression screen [facility] 2/9 01/23/2020  Decreased Interest 0  Down, Depressed, Hopeless 0  PHQ - 2 Score 0  Altered  sleeping 0  Tired, decreased energy 0  Change in appetite 0  Feeling bad or failure about yourself  0  Trouble concentrating 0  Moving slowly or fidgety/restless 0  Suicidal thoughts 0  PHQ-9 Score 0  Difficult doing work/chores Not difficult at all    Health Maintenance Due  Topic Date Due  . Hepatitis C Screening  Never done  . COVID-19 Vaccine (3 - Pfizer risk 4-dose series) 04/26/2020     ROS: Per HPI, otherwise a complete review of systems was negative.   PMH:  The following were reviewed and entered/updated in epic: No past medical history on file. Patient Active Problem List   Diagnosis Date Noted  . Nicotine dependence with current use 06/10/2020   Past Surgical History:  Procedure Laterality Date  . ear tues Bilateral     Family History  Problem Relation Age of Onset  . Diabetes Other        Paternal side  . Alzheimer's disease Other        Maternal side  . Hyperlipidemia Father     Medications- reviewed and updated Current Outpatient Medications  Medication Sig Dispense Refill  . Multiple Vitamin (MULTIVITAMIN WITH MINERALS) TABS Take 1 tablet by mouth daily.    . naproxen sodium (ALEVE) 220 MG tablet Take 220 mg by mouth.    . SODIUM FLUORIDE 5000 SENSITIVE 1.1-5 % PSTE Take by mouth.     No current facility-administered medications for this visit.    Allergies-reviewed and updated Allergies  Allergen Reactions  . Ceclor [Cefaclor]     Unknown   . Cephalosporins Other (See Comments)    unknown    Social History   Socioeconomic History  . Marital status: Single  Spouse name: Not on file  . Number of children: 0  . Years of education: Not on file  . Highest education level: Not on file  Occupational History  . Occupation: Occupational hygienist  Tobacco Use  . Smoking status: Former Smoker    Quit date: 2008    Years since quitting: 14.0  . Smokeless tobacco: Current User    Types: Chew  Vaping Use  . Vaping Use: Never used  Substance and  Sexual Activity  . Alcohol use: Yes  . Drug use: No  . Sexual activity: Yes    Partners: Female  Other Topics Concern  . Not on file  Social History Narrative  . Not on file   Social Determinants of Health   Financial Resource Strain: Not on file  Food Insecurity: Not on file  Transportation Needs: Not on file  Physical Activity: Not on file  Stress: Not on file  Social Connections: Not on file        Objective:  Physical Exam: BP 123/62   Pulse (!) 58   Temp 98.2 F (36.8 C) (Temporal)   Ht 5\' 9"  (1.753 m)   Wt 160 lb 12.8 oz (72.9 kg)   SpO2 96%   BMI 23.75 kg/m   Body mass index is 23.75 kg/m. Wt Readings from Last 3 Encounters:  06/10/20 160 lb 12.8 oz (72.9 kg)  02/26/20 155 lb (70.3 kg)  01/23/20 156 lb (70.8 kg)   Gen: NAD, resting comfortably HEENT: TMs normal bilaterally. OP clear. No thyromegaly noted.  CV: RRR with no murmurs appreciated Pulm: NWOB, CTAB with no crackles, wheezes, or rhonchi GI: Normal bowel sounds present. Soft, Nontender, Nondistended. MSK: no edema, cyanosis, or clubbing noted.  Pain elicited with resisted supraspinatus testing and right shoulder.  Some pain with internal rotation as well.  Normal external rotation.  Neurovascular intact distally. Skin: warm, dry Neuro: CN2-12 grossly intact. Strength 5/5 in upper and lower extremities. Reflexes symmetric and intact bilaterally.  Psych: Normal affect and thought content     Quentin Shorey M. 03/24/20, MD 06/10/2020 3:12 PM

## 2020-06-11 NOTE — Progress Notes (Signed)
Please inform patient of the following:  Labs are all normal. Do not need to make any changes to his treatment plan at this time. Would like for him to keep up the good work and we can recheck in a few years.  [name]. [name], MD 06/11/2020 10:23 AM

## 2021-01-22 DIAGNOSIS — S93402A Sprain of unspecified ligament of left ankle, initial encounter: Secondary | ICD-10-CM | POA: Diagnosis not present

## 2021-01-22 DIAGNOSIS — S99912A Unspecified injury of left ankle, initial encounter: Secondary | ICD-10-CM | POA: Diagnosis not present

## 2021-05-18 ENCOUNTER — Telehealth: Payer: BC Managed Care – PPO | Admitting: Nurse Practitioner

## 2021-05-18 DIAGNOSIS — R112 Nausea with vomiting, unspecified: Secondary | ICD-10-CM

## 2021-05-18 MED ORDER — ONDANSETRON HCL 4 MG PO TABS: 4.0000 mg | ORAL_TABLET | Freq: Three times a day (TID) | ORAL | 0 refills | Status: DC | PRN

## 2021-05-18 NOTE — Progress Notes (Signed)
Virtual Visit Consent   Jeffery Le, you are scheduled for a virtual visit with a [facility] provider today.     Just as with appointments in the office, your consent must be obtained to participate.  Your consent will be active for this visit and any virtual visit you may have with one of our providers in the next 365 days.     If you have a MyChart account, a copy of this consent can be sent to you electronically.  All virtual visits are billed to your insurance company just like a traditional visit in the office.    As this is a virtual visit, video technology does not allow for your provider to perform a traditional examination.  This may limit your provider's ability to fully assess your condition.  If your provider identifies any concerns that need to be evaluated in person or the need to arrange testing (such as labs, EKG, etc.), we will make arrangements to do so.     Although advances in technology are sophisticated, we cannot ensure that it will always work on either your end or our end.  If the connection with a video visit is poor, the visit may have to be switched to a telephone visit.  With either a video or telephone visit, we are not always able to ensure that we have a secure connection.     I need to obtain your verbal consent now.   Are you willing to proceed with your visit today?    Jeffery Le has provided verbal consent on 05/18/2021 for a virtual visit (video or telephone).   Viviano Simas, FNP   Date: 05/18/2021 10:58 AM   Virtual Visit via Video Note   I, Viviano Simas, connected with  Jeffery Le  ([id number], February 25, 1986) on 05/18/21 at 11:00 AM EST by a video-enabled telemedicine application and verified that I am speaking with the correct person using two identifiers.  Location: Patient: Virtual Visit Location Patient: Home Provider: Virtual Visit Location Provider: Home Office   I discussed the limitations of evaluation and  management by telemedicine and the availability of in person appointments. The patient expressed understanding and agreed to proceed.    History of Present Illness: Jeffery Le is a 35 y.o. who identifies as a male who was assigned male at birth, and is being seen today after he had acute onset of nausea and vomiting that has been present for the past 24 hours.   His wife was also sick with similar symptoms prior to his illness as well.  He had a low grade fever over night as well.   He has also had an onset of diarrhea today as well.   He has not vomited today.   Denies any other sick contacts- has been around a lot of contacts.   He is able to keep water down today but nausea is persisting.   Problems:  Patient Active Problem List   Diagnosis Date Noted   Nicotine dependence with current use 06/10/2020    Allergies:  Allergies  Allergen Reactions   Ceclor [Cefaclor]     Unknown    Cephalosporins Other (See Comments)    unknown   Medications:  Current Outpatient Medications:    Multiple Vitamin (MULTIVITAMIN WITH MINERALS) TABS, Take 1 tablet by mouth daily., Disp: , Rfl:    naproxen sodium (ALEVE) 220 MG tablet, Take 220 mg by mouth., Disp: , Rfl:    SODIUM FLUORIDE 5000 SENSITIVE  1.1-5 % PSTE, Take by mouth., Disp: , Rfl:   Observations/Objective: Patient is well-developed, well-nourished in no acute distress.  Resting comfortably at home.  Head is normocephalic, atraumatic.  No labored breathing.  Speech is clear and coherent with logical content.  Patient is alert and oriented at baseline.    Assessment and Plan: 1. Nausea and vomiting, unspecified vomiting type  - ondansetron (ZOFRAN) 4 MG tablet; Take 1 tablet (4 mg total) by mouth every 8 (eight) hours as needed for nausea or vomiting.  Dispense: 20 tablet; Refill: 0    BRAT diet- push fluids discussed hydration and monitoring urine output.   Follow Up Instructions: I discussed the assessment  and treatment plan with the patient. The patient was provided an opportunity to ask questions and all were answered. The patient agreed with the plan and demonstrated an understanding of the instructions.  A copy of instructions were sent to the patient via MyChart unless otherwise noted below.     The patient was advised to call back or seek an in-person evaluation if the symptoms worsen or if the condition fails to improve as anticipated.  Time:  I spent 10 minutes with the patient via telehealth technology discussing the above problems/concerns.    Viviano Simas, FNP

## 2021-06-11 ENCOUNTER — Ambulatory Visit (INDEPENDENT_AMBULATORY_CARE_PROVIDER_SITE_OTHER): Payer: BC Managed Care – PPO | Admitting: Family Medicine

## 2021-06-11 ENCOUNTER — Encounter: Payer: Self-pay | Admitting: Family Medicine

## 2021-06-11 ENCOUNTER — Other Ambulatory Visit: Payer: Self-pay

## 2021-06-11 VITALS — BP 110/68 | HR 64 | Wt 160.0 lb

## 2021-06-11 DIAGNOSIS — Z0001 Encounter for general adult medical examination with abnormal findings: Secondary | ICD-10-CM

## 2021-06-11 DIAGNOSIS — Z13220 Encounter for screening for lipoid disorders: Secondary | ICD-10-CM | POA: Diagnosis not present

## 2021-06-11 DIAGNOSIS — F17200 Nicotine dependence, unspecified, uncomplicated: Secondary | ICD-10-CM

## 2021-06-11 DIAGNOSIS — Z131 Encounter for screening for diabetes mellitus: Secondary | ICD-10-CM

## 2021-06-11 LAB — CBC
HCT: 40.2 % (ref 39.0–52.0)
Hemoglobin: 13.7 g/dL (ref 13.0–17.0)
MCHC: 34.2 g/dL (ref 30.0–36.0)
MCV: 92.5 fl (ref 78.0–100.0)
Platelets: 301 10*3/uL (ref 150.0–400.0)
RBC: 4.34 Mil/uL (ref 4.22–5.81)
RDW: 11.9 % (ref 11.5–15.5)
WBC: 5.5 10*3/uL (ref 4.0–10.5)

## 2021-06-11 LAB — COMPREHENSIVE METABOLIC PANEL
ALT: 24 U/L (ref 0–53)
AST: 30 U/L (ref 0–37)
Albumin: 4.5 g/dL (ref 3.5–5.2)
Alkaline Phosphatase: 39 U/L (ref 39–117)
BUN: 16 mg/dL (ref 6–23)
CO2: 31 mEq/L (ref 19–32)
Calcium: 9.7 mg/dL (ref 8.4–10.5)
Chloride: 98 mEq/L (ref 96–112)
Creatinine, Ser: 0.94 mg/dL (ref 0.40–1.50)
GFR: 105.21 mL/min (ref >60.00)
Glucose, Bld: 82 mg/dL (ref 70–99)
Potassium: 3.9 mEq/L (ref 3.5–5.1)
Sodium: 136 mEq/L (ref 135–145)
Total Bilirubin: 0.9 mg/dL (ref 0.2–1.2)
Total Protein: 7.1 g/dL (ref 6.0–8.3)

## 2021-06-11 LAB — LIPID PANEL
Cholesterol: 127 mg/dL (ref 0–200)
HDL: 48.3 mg/dL (ref >39.00)
LDL Cholesterol: 67 mg/dL (ref 0–99)
NonHDL: 78.55
Total CHOL/HDL Ratio: 3
Triglycerides: 59 mg/dL (ref 0.0–149.0)
VLDL: 11.8 mg/dL (ref 0.0–40.0)

## 2021-06-11 LAB — TSH: TSH: 0.59 u[IU]/mL (ref 0.35–5.50)

## 2021-06-11 LAB — HEMOGLOBIN A1C: Hgb A1c MFr Bld: 5.1 % (ref 4.6–6.5)

## 2021-06-11 NOTE — Progress Notes (Signed)
Chief Complaint:  Jeffery Le is a 36 y.o. male who presents today for his annual comprehensive physical exam.    Assessment/Plan:  Chronic Problems Addressed Today: Nicotine dependence with current use Congratulated patient on cessation.  Preventative Healthcare: Deferred flu vaccine. Will get blood work done today. UTD on other vaccines and screenings.  Patient Counseling(The following topics were reviewed and/or handout was given):  -Nutrition: Stressed importance of moderation in sodium/caffeine intake, saturated fat and cholesterol, caloric balance, sufficient intake of fresh fruits, vegetables, and fiber.  -Stressed the importance of regular exercise.   -Substance Abuse: Discussed cessation/primary prevention of tobacco, alcohol, or other drug use; driving or other dangerous activities under the influence; availability of treatment for abuse.   -Injury prevention: Discussed safety belts, safety helmets, smoke detector, smoking near bedding or upholstery.   -Sexuality: Discussed sexually transmitted diseases, partner selection, use of condoms, avoidance of unintended pregnancy and contraceptive alternatives.   -Dental health: Discussed importance of regular tooth brushing, flossing, and dental visits.  -Health maintenance and immunizations reviewed. Please refer to Health maintenance section.  Return to care in 1 year for next preventative visit.     Subjective:  HPI:  He has no acute complaints today.   Lifestyle Diet: Balanced.  Exercise: 4-5 times weeks.  Depression screen PHQ 2/9 06/11/2021  Decreased Interest 0  Down, Depressed, Hopeless 0  PHQ - 2 Score 0  Altered sleeping -  Tired, decreased energy -  Change in appetite -  Feeling bad or failure about yourself  -  Trouble concentrating -  Moving slowly or fidgety/restless -  Suicidal thoughts -  PHQ-9 Score -  Difficult doing work/chores -    Health Maintenance Due  Topic Date Due   Hepatitis  C Screening  Never done   COVID-19 Vaccine (3 - Pfizer risk series) 04/26/2020     ROS: Per HPI, otherwise a complete review of systems was negative.   PMH:  The following were reviewed and entered/updated in epic: No past medical history on file. Patient Active Problem List   Diagnosis Date Noted   Nicotine dependence with current use 06/10/2020   Past Surgical History:  Procedure Laterality Date   ear tues Bilateral     Family History  Problem Relation Age of Onset   Diabetes Other        Paternal side   Alzheimer's disease Other        Maternal side   Hyperlipidemia Father     Medications- reviewed and updated Current Outpatient Medications  Medication Sig Dispense Refill   Multiple Vitamin (MULTIVITAMIN WITH MINERALS) TABS Take 1 tablet by mouth daily.     naproxen sodium (ALEVE) 220 MG tablet Take 220 mg by mouth.     No current facility-administered medications for this visit.    Allergies-reviewed and updated Allergies  Allergen Reactions   Ceclor [Cefaclor]     Unknown    Cephalosporins Other (See Comments)    unknown    Social History   Socioeconomic History   Marital status: Married    Spouse name: Not on file   Number of children: 0   Years of education: Not on file   Highest education level: Not on file  Occupational History   Occupation: pilot  Tobacco Use   Smoking status: Former   Smokeless tobacco: Former    Types: Chew    Quit date: 2022  Vaping Use   Vaping Use: Never used  Substance and Sexual Activity  Alcohol use: Yes   Drug use: No   Sexual activity: Yes    Partners: Female  Other Topics Concern   Not on file  Social History Narrative   Not on file   Social Determinants of Health   Financial Resource Strain: Not on file  Food Insecurity: Not on file  Transportation Needs: Not on file  Physical Activity: Not on file  Stress: Not on file  Social Connections: Not on file        Objective:  Physical Exam: BP  110/68 (BP Location: Right Arm)    Pulse 64    Wt 160 lb (72.6 kg)    SpO2 98%    BMI 23.63 kg/m   Body mass index is 23.63 kg/m. Wt Readings from Last 3 Encounters:  06/11/21 160 lb (72.6 kg)  06/10/20 160 lb 12.8 oz (72.9 kg)  02/26/20 155 lb (70.3 kg)   Gen: NAD, resting comfortably HEENT: TMs normal bilaterally. OP clear. No thyromegaly noted.  CV: RRR with no murmurs appreciated Pulm: NWOB, CTAB with no crackles, wheezes, or rhonchi GI: Normal bowel sounds present. Soft, Nontender, Nondistended. MSK: no edema, cyanosis, or clubbing noted Skin: warm, dry Neuro: CN2-12 grossly intact. Strength 5/5 in upper and lower extremities. Reflexes symmetric and intact bilaterally.  Psych: Normal affect and thought content      I,Savera Zaman,acting as a scribe for Jacquiline Doe, MD.,have documented all relevant documentation on the behalf of Jacquiline Doe, MD,as directed by  Jacquiline Doe, MD while in the presence of Jacquiline Doe, MD.   I, Jacquiline Doe, MD, have reviewed all documentation for this visit. The documentation on 06/11/21 for the exam, diagnosis, procedures, and orders are all accurate and complete.  [name]. [name], MD 06/11/2021 1:34 PM

## 2021-06-11 NOTE — Assessment & Plan Note (Signed)
Congratulated patient on cessation. 

## 2021-06-11 NOTE — Patient Instructions (Signed)
It was very nice to see you today!  Keep up the good work! No changes today.  We will see back in 1 year for your next physical.  Please come back sooner if needed.  Take care, Dr [name]  PLEASE NOTE:  If you had any lab tests please let us know if you have not heard back within a few days. You may see your results on mychart before we have a chance to review them but we will give you a call once they are reviewed by [place]. If we ordered any referrals today, please let us know if you have not heard from their office within the next week.   Please try these tips to maintain a healthy lifestyle:  Eat at least 3 REAL meals and 1-2 snacks per day.  Aim for no more than 5 hours between eating.  If you eat breakfast, please do so within one hour of getting up.   Each meal should contain half fruits/vegetables, one quarter protein, and one quarter carbs (no bigger than a computer mouse)  Cut down on sweet beverages. This includes juice, soda, and sweet tea.   Drink at least 1 glass of water with each meal and aim for at least 8 glasses per day  Exercise at least 150 minutes every week.    Preventive Care 36 Years Old, Male Preventive care refers to lifestyle choices and visits with your health care provider that can promote health and wellness. Preventive care visits are also called wellness exams. What can I expect for my preventive care visit? Counseling During your preventive care visit, your health care provider may ask about your: Medical history, including: Past medical problems. Family medical history. Current health, including: Emotional well-being. Home life and relationship well-being. Sexual activity. Lifestyle, including: Alcohol, nicotine or tobacco, and drug use. Access to firearms. Diet, exercise, and sleep habits. Safety issues such as seatbelt and bike helmet use. Sunscreen use. Work and work Astronomer. Physical exam Your health care provider may check  your: Height and weight. These may be used to calculate your BMI (body mass index). BMI is a measurement that tells if you are at a healthy weight. Waist circumference. This measures the distance around your waistline. This measurement also tells if you are at a healthy weight and may help predict your risk of certain diseases, such as type 2 diabetes and high blood pressure. Heart rate and blood pressure. Body temperature. Skin for abnormal spots. What immunizations do I need? Vaccines are usually given at various ages, according to a schedule. Your health care provider will recommend vaccines for you based on your age, medical history, and lifestyle or other factors, such as travel or where you work. What tests do I need? Screening Your health care provider may recommend screening tests for certain conditions. This may include: Lipid and cholesterol levels. Diabetes screening. This is done by checking your blood sugar (glucose) after you have not eaten for a while (fasting). Hepatitis B test. Hepatitis C test. HIV (human immunodeficiency virus) test. STI (sexually transmitted infection) testing, if you are at risk. Talk with your health care provider about your test results, treatment options, and if necessary, the need for more tests. Follow these instructions at home: Eating and drinking  Eat a healthy diet that includes fresh fruits and vegetables, whole grains, lean protein, and low-fat dairy products. Drink enough fluid to keep your urine pale yellow. Take vitamin and mineral supplements as recommended by your health care provider. Do  not drink alcohol if your health care provider tells you not to drink. If you drink alcohol: Limit how much you have to 0-2 drinks a day. Know how much alcohol is in your drink. In the [place] one drink equals one 12 oz bottle of beer (355 mL), one 5 oz glass of wine (148 mL), or one 1 oz glass of hard liquor (44 mL). Lifestyle Brush your teeth every  morning and night with fluoride toothpaste. Floss one time each day. Exercise for at least 30 minutes 5 or more days each week. Do not use any products that contain nicotine or tobacco. These products include cigarettes, chewing tobacco, and vaping devices, such as e-cigarettes. If you need help quitting, ask your health care provider. Do not use drugs. If you are sexually active, practice safe sex. Use a condom or other form of protection to prevent STIs. Find healthy ways to manage stress, such as: Meditation, yoga, or listening to music. Journaling. Talking to a trusted person. Spending time with friends and family. Minimize exposure to UV radiation to reduce your risk of skin cancer. Safety Always wear your seat belt while driving or riding in a vehicle. Do not drive: If you have been drinking alcohol. Do not ride with someone who has been drinking. If you have been using any mind-altering substances or drugs. While texting. When you are tired or distracted. Wear a helmet and other protective equipment during sports activities. If you have firearms in your house, make sure you follow all gun safety procedures. Seek help if you have been physically or sexually abused. What's next? Go to your health care provider once a year for an annual wellness visit. Ask your health care provider how often you should have your eyes and teeth checked. Stay up to date on all vaccines. This information is not intended to replace advice given to you by your health care provider. Make sure you discuss any questions you have with your health care provider. Document Revised: 11/05/2020 Document Reviewed: 11/05/2020 Elsevier Patient Education  2022 [employer].

## 2021-06-12 NOTE — Progress Notes (Signed)
Please inform patient of the following:  Good news! Labs are all normal. Would like for him to keep up the good work and we can recheck in a year.  Jeffery Le. [name], MD 06/12/2021 12:10 PM

## 2021-08-03 DIAGNOSIS — D2272 Melanocytic nevi of left lower limb, including hip: Secondary | ICD-10-CM | POA: Diagnosis not present

## 2021-08-03 DIAGNOSIS — D1801 Hemangioma of skin and subcutaneous tissue: Secondary | ICD-10-CM | POA: Diagnosis not present

## 2021-08-03 DIAGNOSIS — L814 Other melanin hyperpigmentation: Secondary | ICD-10-CM | POA: Diagnosis not present

## 2021-08-03 DIAGNOSIS — X32XXXS Exposure to sunlight, sequela: Secondary | ICD-10-CM | POA: Diagnosis not present

## 2021-10-28 DIAGNOSIS — S90211A Contusion of right great toe with damage to nail, initial encounter: Secondary | ICD-10-CM | POA: Diagnosis not present

## 2021-11-19 ENCOUNTER — Encounter: Payer: Self-pay | Admitting: Family Medicine

## 2021-11-19 ENCOUNTER — Ambulatory Visit: Payer: BC Managed Care – PPO | Admitting: Family Medicine

## 2021-11-19 VITALS — BP 110/70 | HR 61 | Temp 98.4°F | Ht 69.0 in | Wt 162.4 lb

## 2021-11-19 DIAGNOSIS — L03011 Cellulitis of right finger: Secondary | ICD-10-CM

## 2021-11-19 DIAGNOSIS — L918 Other hypertrophic disorders of the skin: Secondary | ICD-10-CM | POA: Diagnosis not present

## 2021-11-19 MED ORDER — DOXYCYCLINE HYCLATE 100 MG PO TABS: 100.0000 mg | ORAL_TABLET | Freq: Two times a day (BID) | ORAL | 0 refills | Status: AC

## 2021-11-19 NOTE — Progress Notes (Signed)
Subjective  CC:  Chief Complaint  Patient presents with   Skin Tag    Pt has a skin tag on the Lt side of his neck that he would like to get check out. It is not painful but he just noticed it 2 days ago.    HPI: Jeffery Le is a 36 y.o. male who presents to the office today to address the problems listed above in the chief complaint. Skin tag on neck; wants to make sure not a concerning mole. No sxs.  Hang nail on right finger, mildly sore. No drainage.   Assessment  1. Skin tag   2. Paronychia of finger of right hand      Plan  Skin tag:  reassured. Benign.  Early paronychia: warm salt water soaks and monitor. Start doxy if worsening.   Follow up: prn  Visit date not found  No orders of the defined types were placed in this encounter.  Meds ordered this encounter  Medications   doxycycline (VIBRA-TABS) 100 MG tablet    Sig: Take 1 tablet (100 mg total) by mouth 2 (two) times daily for 7 days.    Dispense:  14 tablet    Refill:  0      I reviewed the patients updated PMH, FH, and SocHx.    Patient Active Problem List   Diagnosis Date Noted   Nicotine dependence with current use 06/10/2020   Current Meds  Medication Sig   doxycycline (VIBRA-TABS) 100 MG tablet Take 1 tablet (100 mg total) by mouth 2 (two) times daily for 7 days.   Multiple Vitamin (MULTIVITAMIN WITH MINERALS) TABS Take 1 tablet by mouth daily.    Allergies: Patient is allergic to ceclor [cefaclor] and cephalosporins. Family History: Patient family history includes Alzheimer's disease in an other family member; Diabetes in an other family member; Hyperlipidemia in his father. Social History:  Patient  reports that he has quit smoking. He quit smokeless tobacco use about 17 months ago.  His smokeless tobacco use included chew. He reports current alcohol use. He reports that he does not use drugs.  Review of Systems: Constitutional: Negative for fever malaise or  anorexia Cardiovascular: negative for chest pain Respiratory: negative for SOB or persistent cough Gastrointestinal: negative for abdominal pain  Objective  Vitals: BP 110/70   Pulse 61   Temp 98.4 F (36.9 C)   Ht 5\' 9"  (1.753 m)   Wt 162 lb 6.4 oz (73.7 kg)   SpO2 97%   BMI 23.98 kg/m  General: no acute distress , A&Ox3  Skin:  Warm, no rashes, left neck with small benign appearing skin tag. Right finger with redness and mild ttp over lateral paronychia. No drainage or fluctuance    Commons side effects, risks, benefits, and alternatives for medications and treatment plan prescribed today were discussed, and the patient expressed understanding of the given instructions. Patient is instructed to call or message via MyChart if he/she has any questions or concerns regarding our treatment plan. No barriers to understanding were identified. We discussed Red Flag symptoms and signs in detail. Patient expressed understanding regarding what to do in case of urgent or emergency type symptoms.  Medication list was reconciled, printed and provided to the patient in AVS. Patient instructions and summary information was reviewed with the patient as documented in the AVS. This note was prepared with assistance of Dragon voice recognition software. Occasional wrong-word or sound-a-like substitutions may have occurred due to the inherent limitations of  voice recognition software  This visit occurred during the SARS-CoV-2 public health emergency.  Safety protocols were in place, including screening questions prior to the visit, additional usage of staff PPE, and extensive cleaning of exam room while observing appropriate contact time as indicated for disinfecting solutions.

## 2021-11-19 NOTE — Patient Instructions (Signed)
Please follow up if symptoms do not improve or as needed.    Skin Tag, Adult  A skin tag (acrochordon) is a soft, extra growth of skin. Most skin tags are skin-colored and rarely bigger than a pencil eraser. They commonly form in areas where there is frequent rubbing, or friction, on the skin. This may be where there are folds in the skin, such as the eyelids, neck, armpit, or groin. Skin tags are not dangerous, and they do not spread from person to person (are not contagious). You may have one skin tag or several. Skin tags do not require treatment. However, your health care provider may recommend removal of a skin tag if it: Gets irritated from clothing or jewelry. Bleeds. Is visible and unsightly. What are the causes? This condition is linked with: Increasing age. Pregnancy. Diabetes. Obesity. What are the signs or symptoms? Skin tags usually do not cause symptoms unless they get irritated by items touching your skin, such as clothing or jewelry. When this happens, you may have pain, itching, or bleeding. How is this diagnosed? This condition is diagnosed with an evaluation from your health care provider. No testing is needed for diagnosis. How is this treated? Treatment for this condition depends on whether you have symptoms. If a skin tag needs to be removed, your health care provider can remove it with: A simple surgical procedure using scissors. A procedure that involves freezing your skin tag with a gas in liquid form (liquid nitrogen). A procedure that uses heat to destroy your skin tag (electrodessication). Your health care provider may also remove your skin tag if it is visible or unsightly, Follow these instructions at home: Watch for any changes in your skin tag. A normal skin tag does not require any other special care at home. Take over-the-counter and prescription medicines only as told by your health care provider. Keep all follow-up visits as told by your health care  provider. This is important. Contact a health care provider if: You have a skin tag that: Becomes painful. Changes color. Bleeds. Swells. Summary Skin tags are soft, extra growths of skin found in areas of frequent rubbing or friction. Skin tags usually do not cause symptoms. If symptoms occur, you may have pain, itching, or bleeding. If your skin tag causes symptoms or is unsightly, your health care provider can remove it. This information is not intended to replace advice given to you by your health care provider. Make sure you discuss any questions you have with your health care provider. Document Revised: 03/12/2019 Document Reviewed: 03/12/2019 Elsevier Patient Education  2022 [employer].

## 2022-02-15 ENCOUNTER — Encounter: Payer: Self-pay | Admitting: *Deleted

## 2022-03-11 DIAGNOSIS — M79672 Pain in left foot: Secondary | ICD-10-CM | POA: Diagnosis not present

## 2022-04-06 DIAGNOSIS — Z3141 Encounter for fertility testing: Secondary | ICD-10-CM | POA: Diagnosis not present

## 2022-04-08 DIAGNOSIS — M79675 Pain in left toe(s): Secondary | ICD-10-CM | POA: Diagnosis not present

## 2022-04-09 DIAGNOSIS — Z113 Encounter for screening for infections with a predominantly sexual mode of transmission: Secondary | ICD-10-CM | POA: Diagnosis not present

## 2022-04-28 DIAGNOSIS — Z113 Encounter for screening for infections with a predominantly sexual mode of transmission: Secondary | ICD-10-CM | POA: Diagnosis not present

## 2022-04-28 DIAGNOSIS — Z3141 Encounter for fertility testing: Secondary | ICD-10-CM | POA: Diagnosis not present

## 2022-05-06 ENCOUNTER — Encounter: Payer: Self-pay | Admitting: *Deleted

## 2022-05-28 DIAGNOSIS — M19071 Primary osteoarthritis, right ankle and foot: Secondary | ICD-10-CM | POA: Diagnosis not present

## 2022-05-28 DIAGNOSIS — M7661 Achilles tendinitis, right leg: Secondary | ICD-10-CM | POA: Diagnosis not present

## 2022-05-28 DIAGNOSIS — M79671 Pain in right foot: Secondary | ICD-10-CM | POA: Diagnosis not present

## 2022-06-04 DIAGNOSIS — Z3141 Encounter for fertility testing: Secondary | ICD-10-CM | POA: Diagnosis not present

## 2022-06-23 DIAGNOSIS — Z113 Encounter for screening for infections with a predominantly sexual mode of transmission: Secondary | ICD-10-CM | POA: Diagnosis not present

## 2022-08-12 DIAGNOSIS — M25561 Pain in right knee: Secondary | ICD-10-CM | POA: Diagnosis not present

## 2022-08-12 DIAGNOSIS — S8001XA Contusion of right knee, initial encounter: Secondary | ICD-10-CM | POA: Diagnosis not present

## 2022-08-16 DIAGNOSIS — L218 Other seborrheic dermatitis: Secondary | ICD-10-CM | POA: Diagnosis not present

## 2022-08-16 DIAGNOSIS — D2272 Melanocytic nevi of left lower limb, including hip: Secondary | ICD-10-CM | POA: Diagnosis not present

## 2022-08-16 DIAGNOSIS — D229 Melanocytic nevi, unspecified: Secondary | ICD-10-CM | POA: Diagnosis not present

## 2022-08-16 DIAGNOSIS — D225 Melanocytic nevi of trunk: Secondary | ICD-10-CM | POA: Diagnosis not present

## 2022-10-13 NOTE — Progress Notes (Signed)
[facility] [address] [place] [address] Phone: [phone]   Assessment and Plan:     1. Chronic pain of right knee 2. Right foot pain 3. Somatic dysfunction of cervical region 4. Somatic dysfunction of thoracic region 5. Somatic dysfunction of lumbar region 6. Somatic dysfunction of pelvic region 7. Somatic dysfunction of rib region -Chronic with exacerbation, initial sports medicine visit - Most consistent with patellar tendinosis versus patellofemoral syndrome, and plan fasciitis based on HPI and physical exam - Patient is planning on running a marathon in 05/2023 and wants to be proactive and preventative - Also requesting OMT to help decrease multiple areas of somatic dysfunction, also with the hope of decreasing pain in right lower extremity - Patient elected for initial OMT today.  Tolerated well per note below. - Decision today to treat with OMT was based on Physical Exam  After verbal consent patient was treated with HVLA (high velocity low amplitude), ME (muscle energy), FPR (flex positional release), ST (soft tissue), PC/PD (Pelvic Compression/ Pelvic Decompression) techniques in cervical, rib, thoracic, lumbar, and pelvic areas. Patient tolerated the procedure well with improvement in symptoms.  Patient educated on potential side effects of soreness and recommended to rest, hydrate, and use Tylenol as needed for pain control.  Other orders - meloxicam (MOBIC) 15 MG tablet; Take 1 tablet (15 mg total) by mouth daily.    Pertinent previous records reviewed include none   Follow Up: 4 weeks for reevaluation.  If no improvement or worsening of symptoms, could obtain x-ray and discuss additional treatments for knee versus plan fasciitis.  Could also repeat OMT if patient found today's treatment beneficial   Subjective:   I, Jeffery Le, am serving as a Neurosurgeon for Doctor Richardean Sale  Chief Complaint: MSK    HPI:   10/14/2022 Patient is a 37 year old male complaining of right knee pain . Patient states he has right knee pain for years, medial knee pain , pain is just there, right foot sore and tight in the morning thinks its plantar fascitis from the arch to the heel , and a back reset, this is all preventative he and his wife has a race in January, tylenol intermittently doesn't know if it helps   Relevant Historical Information: None pertinent  Additional pertinent review of systems negative.   Current Outpatient Medications:    meloxicam (MOBIC) 15 MG tablet, Take 1 tablet (15 mg total) by mouth daily., Disp: 30 tablet, Rfl: 0   Multiple Vitamin (MULTIVITAMIN WITH MINERALS) TABS, Take 1 tablet by mouth daily., Disp: , Rfl:    naproxen sodium (ALEVE) 220 MG tablet, Take 220 mg by mouth., Disp: , Rfl:    Objective:     Vitals:   10/14/22 1341  BP: 110/80  Pulse: 63  SpO2: 97%  Weight: 158 lb (71.7 kg)  Height: 5\' 9"  (1.753 m)      Body mass index is 23.33 kg/m.    Physical Exam:    General:  awake, alert oriented, no acute distress nontoxic Skin: no suspicious lesions or rashes Neuro:sensation intact and strength 5/5 with no deficits, no atrophy, normal muscle tone Psych: No signs of anxiety, depression or other mood disorder  Right knee: No swelling No deformity Neg fluid wave, joint milking ROM Flex 110, Ext 0 TTP inferior patella, patellar tendon NTTP over the quad tendon, medial fem condyle, lat fem condyle,   tibial tuberostiy, fibular head, posterior fossa, pes  anserine bursa, gerdy's tubercle, medial jt line, lateral jt line Neg anterior and posterior drawer Neg lachman Neg sag sign Negative varus stress Negative valgus stress Negative McMurray Negative Thessaly  gait normal     OMT Physical Exam:  ASIS Compression Test: Positive Right Cervical: TTP paraspinal, C5 RRSL, C3 SL R RR Rib: Bilateral elevated first rib with TTP Thoracic: TTP paraspinal,  T4-6 RRSL, T7-9 RLSR Lumbar: TTP paraspinal, L1-3 RRSL, L5 RL Pelvis: Right anterior innominate   Electronically signed by:  [facility] 2:39 PM 10/14/22

## 2022-10-14 ENCOUNTER — Ambulatory Visit: Payer: BC Managed Care – PPO | Admitting: Sports Medicine

## 2022-10-14 VITALS — BP 110/80 | HR 63 | Ht 69.0 in | Wt 158.0 lb

## 2022-10-14 DIAGNOSIS — G8929 Other chronic pain: Secondary | ICD-10-CM | POA: Diagnosis not present

## 2022-10-14 DIAGNOSIS — M79671 Pain in right foot: Secondary | ICD-10-CM | POA: Diagnosis not present

## 2022-10-14 DIAGNOSIS — M9908 Segmental and somatic dysfunction of rib cage: Secondary | ICD-10-CM

## 2022-10-14 DIAGNOSIS — M9901 Segmental and somatic dysfunction of cervical region: Secondary | ICD-10-CM | POA: Diagnosis not present

## 2022-10-14 DIAGNOSIS — M9903 Segmental and somatic dysfunction of lumbar region: Secondary | ICD-10-CM | POA: Diagnosis not present

## 2022-10-14 DIAGNOSIS — M25561 Pain in right knee: Secondary | ICD-10-CM

## 2022-10-14 DIAGNOSIS — M9905 Segmental and somatic dysfunction of pelvic region: Secondary | ICD-10-CM | POA: Diagnosis not present

## 2022-10-14 DIAGNOSIS — M9902 Segmental and somatic dysfunction of thoracic region: Secondary | ICD-10-CM | POA: Diagnosis not present

## 2022-10-14 MED ORDER — MELOXICAM 15 MG PO TABS: 15.0000 mg | ORAL_TABLET | Freq: Every day | ORAL | 0 refills | Status: DC

## 2022-10-14 NOTE — Patient Instructions (Addendum)
Good to see you  - Start meloxicam 15 mg daily x2 weeks.  If still having pain after 2 weeks, complete 3rd-week of meloxicam. May use remaining meloxicam as needed once daily for pain control.  Do not to use additional NSAIDs while taking meloxicam.  May use Tylenol [phone] mg 2 to 3 times a day for breakthrough pain. Knee and foot HEP  Patellar knee strap when running  4 week follow up

## 2022-10-21 ENCOUNTER — Encounter: Payer: Self-pay | Admitting: Family Medicine

## 2022-10-21 ENCOUNTER — Ambulatory Visit: Payer: BC Managed Care – PPO | Admitting: Family Medicine

## 2022-10-21 VITALS — BP 120/70 | HR 70 | Temp 98.4°F | Ht 69.0 in | Wt 160.0 lb

## 2022-10-21 DIAGNOSIS — B356 Tinea cruris: Secondary | ICD-10-CM | POA: Diagnosis not present

## 2022-10-21 DIAGNOSIS — N50812 Left testicular pain: Secondary | ICD-10-CM | POA: Diagnosis not present

## 2022-10-21 MED ORDER — KETOCONAZOLE 2 % EX CREA: 1.0000 | TOPICAL_CREAM | Freq: Two times a day (BID) | CUTANEOUS | 0 refills | Status: AC

## 2022-10-21 NOTE — Progress Notes (Signed)
   Jeffery Le is a 37 y.o. male who presents today for an office visit.  Assessment/Plan:  Testicular Pain Normal testicular exam though did have evidence of hernia on exam.  Will check ultrasound to further evaluate testicle itself and confirm inguinal hernia diagnosis.  Symptoms are currently mild.  We did discuss reasons to return to care and seek emergent care.  Tinea Cruris  No red flags.  Start topical ketoconazole.  He will come back soon for annual physical.    Subjective:  HPI:  Patient here with left testicle pain. Started about 2 weeks ago. He has been training for a marathon. He did notice some irritation to the area initially. A couple of days later had some irritation to the area again. He did a self exam and thought that his left testicle was a little enlarged.  Pain comes and goes. Nothing makes it better worse.        Objective:  Physical Exam: BP 120/70   Pulse 70   Temp 98.4 F (36.9 C) (Temporal)   Ht 5\' 9"  (1.753 m)   Wt 160 lb (72.6 kg)   SpO2 98%   BMI 23.63 kg/m   Gen: No acute distress, resting comfortably CV: Regular rate and rhythm with no murmurs appreciated Pulm: Normal work of breathing, clear to auscultation bilaterally with no crackles, wheezes, or rhonchi GU: Normal male genitalia.  Slightly macerated erythematous rash on left inguinal crease.  Testicles palpated without masses or abnormalities.  Bulge noted in left inguinal area with Valsalva. Neuro: Grossly normal, moves all extremities Psych: Normal affect and thought content      Jeffery Le M. [name], MD 10/21/2022 1:46 PM

## 2022-10-21 NOTE — Patient Instructions (Signed)
It was very nice to see you today!  I think you probably have an inguinal hernia.  We will check an ultrasound to confirm this.  Please start the ketoconazole for the jock itch.  Return for Annual Physical.   Take care, Dr [name]  PLEASE NOTE:  If you had any lab tests, please let us know if you have not heard back within a few days. You may see your results on mychart before we have a chance to review them but we will give you a call once they are reviewed by [place].   If we ordered any referrals today, please let us know if you have not heard from their office within the next week.   If you had any urgent prescriptions sent in today, please check with the pharmacy within an hour of our visit to make sure the prescription was transmitted appropriately.   Please try these tips to maintain a healthy lifestyle:  Eat at least 3 REAL meals and 1-2 snacks per day.  Aim for no more than 5 hours between eating.  If you eat breakfast, please do so within one hour of getting up.   Each meal should contain half fruits/vegetables, one quarter protein, and one quarter carbs (no bigger than a computer mouse)  Cut down on sweet beverages. This includes juice, soda, and sweet tea.   Drink at least 1 glass of water with each meal and aim for at least 8 glasses per day  Exercise at least 150 minutes every week.

## 2022-10-25 ENCOUNTER — Ambulatory Visit (INDEPENDENT_AMBULATORY_CARE_PROVIDER_SITE_OTHER): Payer: BC Managed Care – PPO | Admitting: Family Medicine

## 2022-10-25 ENCOUNTER — Encounter: Payer: Self-pay | Admitting: Family Medicine

## 2022-10-25 VITALS — BP 118/64 | HR 71 | Temp 97.3°F | Ht 69.0 in | Wt 159.4 lb

## 2022-10-25 DIAGNOSIS — Z1159 Encounter for screening for other viral diseases: Secondary | ICD-10-CM | POA: Diagnosis not present

## 2022-10-25 DIAGNOSIS — Z131 Encounter for screening for diabetes mellitus: Secondary | ICD-10-CM

## 2022-10-25 DIAGNOSIS — Z0001 Encounter for general adult medical examination with abnormal findings: Secondary | ICD-10-CM | POA: Diagnosis not present

## 2022-10-25 DIAGNOSIS — Z13220 Encounter for screening for lipoid disorders: Secondary | ICD-10-CM

## 2022-10-25 LAB — COMPREHENSIVE METABOLIC PANEL
ALT: 28 U/L (ref 0–53)
AST: 38 U/L — ABNORMAL HIGH (ref 0–37)
Albumin: 4.6 g/dL (ref 3.5–5.2)
Alkaline Phosphatase: 42 U/L (ref 39–117)
BUN: 22 mg/dL (ref 6–23)
CO2: 30 mEq/L (ref 19–32)
Calcium: 9.6 mg/dL (ref 8.4–10.5)
Chloride: 99 mEq/L (ref 96–112)
Creatinine, Ser: 0.98 mg/dL (ref 0.40–1.50)
GFR: 99.12 mL/min (ref >60.00)
Glucose, Bld: 90 mg/dL (ref 70–99)
Potassium: 4.4 mEq/L (ref 3.5–5.1)
Sodium: 136 mEq/L (ref 135–145)
Total Bilirubin: 1.1 mg/dL (ref 0.2–1.2)
Total Protein: 7 g/dL (ref 6.0–8.3)

## 2022-10-25 LAB — CBC
HCT: 41.1 % (ref 39.0–52.0)
Hemoglobin: 13.9 g/dL (ref 13.0–17.0)
MCHC: 33.7 g/dL (ref 30.0–36.0)
MCV: 93.1 fl (ref 78.0–100.0)
Platelets: 332 10*3/uL (ref 150.0–400.0)
RBC: 4.42 Mil/uL (ref 4.22–5.81)
RDW: 11.9 % (ref 11.5–15.5)
WBC: 6.4 10*3/uL (ref 4.0–10.5)

## 2022-10-25 LAB — LIPID PANEL
Cholesterol: 169 mg/dL (ref 0–200)
HDL: 50.7 mg/dL (ref >39.00)
LDL Cholesterol: 97 mg/dL (ref 0–99)
NonHDL: 118.76
Total CHOL/HDL Ratio: 3
Triglycerides: 108 mg/dL (ref 0.0–149.0)
VLDL: 21.6 mg/dL (ref 0.0–40.0)

## 2022-10-25 LAB — TSH: TSH: 1.05 u[IU]/mL (ref 0.35–5.50)

## 2022-10-25 LAB — HEMOGLOBIN A1C: Hgb A1c MFr Bld: 4.9 % (ref 4.6–6.5)

## 2022-10-25 NOTE — Patient Instructions (Signed)
It was very nice to see you today!  We will check blood work today.   Please continue to work on diet and exercise.  Return in about 1 year (around 10/25/2023) for Annual Physical.   Take care, Dr [name]  PLEASE NOTE:  If you had any lab tests, please let us know if you have not heard back within a few days. You may see your results on mychart before we have a chance to review them but we will give you a call once they are reviewed by [place].   If we ordered any referrals today, please let us know if you have not heard from their office within the next week.   If you had any urgent prescriptions sent in today, please check with the pharmacy within an hour of our visit to make sure the prescription was transmitted appropriately.   Please try these tips to maintain a healthy lifestyle:  Eat at least 3 REAL meals and 1-2 snacks per day.  Aim for no more than 5 hours between eating.  If you eat breakfast, please do so within one hour of getting up.   Each meal should contain half fruits/vegetables, one quarter protein, and one quarter carbs (no bigger than a computer mouse)  Cut down on sweet beverages. This includes juice, soda, and sweet tea.   Drink at least 1 glass of water with each meal and aim for at least 8 glasses per day  Exercise at least 150 minutes every week.    Preventive Care 50-21 Years Old, Male Preventive care refers to lifestyle choices and visits with your health care provider that can promote health and wellness. Preventive care visits are also called wellness exams. What can I expect for my preventive care visit? Counseling During your preventive care visit, your health care provider may ask about your: Medical history, including: Past medical problems. Family medical history. Current health, including: Emotional well-being. Home life and relationship well-being. Sexual activity. Lifestyle, including: Alcohol, nicotine or tobacco, and drug use. Access to  firearms. Diet, exercise, and sleep habits. Safety issues such as seatbelt and bike helmet use. Sunscreen use. Work and work Astronomer. Physical exam Your health care provider may check your: Height and weight. These may be used to calculate your BMI (body mass index). BMI is a measurement that tells if you are at a healthy weight. Waist circumference. This measures the distance around your waistline. This measurement also tells if you are at a healthy weight and may help predict your risk of certain diseases, such as type 2 diabetes and high blood pressure. Heart rate and blood pressure. Body temperature. Skin for abnormal spots. What immunizations do I need?  Vaccines are usually given at various ages, according to a schedule. Your health care provider will recommend vaccines for you based on your age, medical history, and lifestyle or other factors, such as travel or where you work. What tests do I need? Screening Your health care provider may recommend screening tests for certain conditions. This may include: Lipid and cholesterol levels. Diabetes screening. This is done by checking your blood sugar (glucose) after you have not eaten for a while (fasting). Hepatitis B test. Hepatitis C test. HIV (human immunodeficiency virus) test. STI (sexually transmitted infection) testing, if you are at risk. Talk with your health care provider about your test results, treatment options, and if necessary, the need for more tests. Follow these instructions at home: Eating and drinking  Eat a healthy diet that includes  fresh fruits and vegetables, whole grains, lean protein, and low-fat dairy products. Drink enough fluid to keep your urine pale yellow. Take vitamin and mineral supplements as recommended by your health care provider. Do not drink alcohol if your health care provider tells you not to drink. If you drink alcohol: Limit how much you have to 0-2 drinks a day. Know how much  alcohol is in your drink. In the [place] one drink equals one 12 oz bottle of beer (355 mL), one 5 oz glass of wine (148 mL), or one 1 oz glass of hard liquor (44 mL). Lifestyle Brush your teeth every morning and night with fluoride toothpaste. Floss one time each day. Exercise for at least 30 minutes 5 or more days each week. Do not use any products that contain nicotine or tobacco. These products include cigarettes, chewing tobacco, and vaping devices, such as e-cigarettes. If you need help quitting, ask your health care provider. Do not use drugs. If you are sexually active, practice safe sex. Use a condom or other form of protection to prevent STIs. Find healthy ways to manage stress, such as: Meditation, yoga, or listening to music. Journaling. Talking to a trusted person. Spending time with friends and family. Minimize exposure to UV radiation to reduce your risk of skin cancer. Safety Always wear your seat belt while driving or riding in a vehicle. Do not drive: If you have been drinking alcohol. Do not ride with someone who has been drinking. If you have been using any mind-altering substances or drugs. While texting. When you are tired or distracted. Wear a helmet and other protective equipment during sports activities. If you have firearms in your house, make sure you follow all gun safety procedures. Seek help if you have been physically or sexually abused. What's next? Go to your health care provider once a year for an annual wellness visit. Ask your health care provider how often you should have your eyes and teeth checked. Stay up to date on all vaccines. This information is not intended to replace advice given to you by your health care provider. Make sure you discuss any questions you have with your health care provider. Document Revised: 11/05/2020 Document Reviewed: 11/05/2020 Elsevier Patient Education  2024 [employer].

## 2022-10-25 NOTE — Progress Notes (Signed)
Chief Complaint:  Jeffery Le is a 37 y.o. male who presents today for his annual comprehensive physical exam.    Assessment/Plan:  Testicular Pain Saw him for this last week.  Concern for possible hernia.  No new updates.  Ultrasound is pending.  Right knee pain Follows with sports medicine.  Potentially patellofemoral syndrome.  Working on home exercises and taking meloxicam.  Preventative Healthcare: Check labs.   Patient Counseling(The following topics were reviewed and/or handout was given):  -Nutrition: Stressed importance of moderation in sodium/caffeine intake, saturated fat and cholesterol, caloric balance, sufficient intake of fresh fruits, vegetables, and fiber.  -Stressed the importance of regular exercise.   -Substance Abuse: Discussed cessation/primary prevention of tobacco, alcohol, or other drug use; driving or other dangerous activities under the influence; availability of treatment for abuse.   -Injury prevention: Discussed safety belts, safety helmets, smoke detector, smoking near bedding or upholstery.   -Sexuality: Discussed sexually transmitted diseases, partner selection, use of condoms, avoidance of unintended pregnancy and contraceptive alternatives.   -Dental health: Discussed importance of regular tooth brushing, flossing, and dental visits.  -Health maintenance and immunizations reviewed. Please refer to Health maintenance section.  Return to care in 1 year for next preventative visit.     Subjective:  HPI:  He has no acute complaints today.   Lifestyle Diet: Balanced. Plenty of fruits and vegetables.  Exercise: Training for marathon.      10/25/2022    8:05 AM  Depression screen PHQ 2/9  Decreased Interest 0  Down, Depressed, Hopeless 0  PHQ - 2 Score 0    There are no preventive care reminders to display for this patient.   ROS: Per HPI, otherwise a complete review of systems was negative.   PMH:  The following were reviewed  and entered/updated in epic: History reviewed. No pertinent past medical history. There are no problems to display for this patient.  Past Surgical History:  Procedure Laterality Date   ear tues Bilateral     Family History  Problem Relation Age of Onset   Diabetes Other        Paternal side   Alzheimer's disease Other        Maternal side   Hyperlipidemia Father     Medications- reviewed and updated Current Outpatient Medications  Medication Sig Dispense Refill   ketoconazole (NIZORAL) 2 % cream Apply 1 Application topically 2 (two) times daily. 60 g 0   meloxicam (MOBIC) 15 MG tablet Take 1 tablet (15 mg total) by mouth daily. 30 tablet 0   Multiple Vitamin (MULTIVITAMIN WITH MINERALS) TABS Take 1 tablet by mouth daily.     naproxen sodium (ALEVE) 220 MG tablet Take 220 mg by mouth.     No current facility-administered medications for this visit.    Allergies-reviewed and updated Allergies  Allergen Reactions   Ceclor [Cefaclor]     Unknown    Cephalosporins Other (See Comments)    unknown    Social History   Socioeconomic History   Marital status: Married    Spouse name: Not on file   Number of children: 0   Years of education: Not on file   Highest education level: Not on file  Occupational History   Occupation: pilot  Tobacco Use   Smoking status: Former   Smokeless tobacco: Former    Types: Chew    Quit date: 2022  Vaping Use   Vaping Use: Never used  Substance and Sexual Activity   Alcohol  use: Yes   Drug use: No   Sexual activity: Yes    Partners: Female  Other Topics Concern   Not on file  Social History Narrative   Not on file   Social Determinants of Health   Financial Resource Strain: Not on file  Food Insecurity: Not on file  Transportation Needs: Not on file  Physical Activity: Not on file  Stress: Not on file  Social Connections: Not on file        Objective:  Physical Exam: BP 118/64   Pulse 71   Temp (!) 97.3 F (36.3  C) (Temporal)   Ht 5\' 9"  (1.753 m)   Wt 159 lb 6.4 oz (72.3 kg)   SpO2 98%   BMI 23.54 kg/m   Body mass index is 23.54 kg/m. Wt Readings from Last 3 Encounters:  10/25/22 159 lb 6.4 oz (72.3 kg)  10/21/22 160 lb (72.6 kg)  10/14/22 158 lb (71.7 kg)   Gen: NAD, resting comfortably HEENT: TMs normal bilaterally. OP clear. No thyromegaly noted.  CV: RRR with no murmurs appreciated Pulm: NWOB, CTAB with no crackles, wheezes, or rhonchi GI: Normal bowel sounds present. Soft, Nontender, Nondistended. MSK: no edema, cyanosis, or clubbing noted Skin: warm, dry Neuro: CN2-12 grossly intact. Strength 5/5 in upper and lower extremities. Reflexes symmetric and intact bilaterally.  Psych: Normal affect and thought content     [name] M. [name], MD 10/25/2022 8:26 AM

## 2022-10-26 LAB — HEPATITIS C ANTIBODY: Hepatitis C Ab: NONREACTIVE

## 2022-10-27 ENCOUNTER — Telehealth: Payer: Self-pay | Admitting: Family Medicine

## 2022-10-27 NOTE — Telephone Encounter (Signed)
Pt would like a call back with results. 

## 2022-10-27 NOTE — Progress Notes (Signed)
Great news! Labs are all stable.  Do not need to make any changes to treatment plan at this time.  He should continue to work on diet and exercise and we can recheck again in a few years.

## 2022-10-28 NOTE — Telephone Encounter (Signed)
Patient returned call. Requests to be called. 

## 2022-10-28 NOTE — Telephone Encounter (Signed)
Pt would like a call back with results. 

## 2022-10-29 ENCOUNTER — Other Ambulatory Visit: Payer: Self-pay | Admitting: Family Medicine

## 2022-10-29 NOTE — Telephone Encounter (Signed)
Left message to return call to our office at their convenience.  Results Seen by patient [facility] on 10/27/2022  2:37 PM

## 2022-10-31 DIAGNOSIS — M545 Low back pain, unspecified: Secondary | ICD-10-CM | POA: Diagnosis not present

## 2022-10-31 DIAGNOSIS — R102 Pelvic and perineal pain: Secondary | ICD-10-CM | POA: Diagnosis not present

## 2022-11-01 ENCOUNTER — Ambulatory Visit
Admission: RE | Admit: 2022-11-01 | Discharge: 2022-11-01 | Disposition: A | Discharge status: Home / Self Care | Payer: BC Managed Care – PPO | Source: Ambulatory Visit | Attending: Family Medicine | Admitting: Family Medicine

## 2022-11-01 DIAGNOSIS — N50812 Left testicular pain: Secondary | ICD-10-CM

## 2022-11-01 DIAGNOSIS — R102 Pelvic and perineal pain: Secondary | ICD-10-CM | POA: Diagnosis not present

## 2022-11-02 ENCOUNTER — Encounter: Payer: Self-pay | Admitting: Family Medicine

## 2022-11-02 ENCOUNTER — Ambulatory Visit: Payer: BC Managed Care – PPO | Admitting: Family Medicine

## 2022-11-02 VITALS — BP 119/71 | HR 60 | Temp 97.8°F | Ht 69.0 in | Wt 159.0 lb

## 2022-11-02 DIAGNOSIS — R1030 Lower abdominal pain, unspecified: Secondary | ICD-10-CM | POA: Diagnosis not present

## 2022-11-02 NOTE — Patient Instructions (Addendum)
It was very nice to see you today!  Please take 1 dose of MiraLAX daily to help with constipation.  Let me know in a few days how this is working for you.  If not improving then we can get a CT scan.  Return if symptoms worsen or fail to improve.   Take care, Dr [name]  PLEASE NOTE:  If you had any lab tests, please let us know if you have not heard back within a few days. You may see your results on mychart before we have a chance to review them but we will give you a call once they are reviewed by [place].   If we ordered any referrals today, please let us know if you have not heard from their office within the next week.   If you had any urgent prescriptions sent in today, please check with the pharmacy within an hour of our visit to make sure the prescription was transmitted appropriately.   Please try these tips to maintain a healthy lifestyle:  Eat at least 3 REAL meals and 1-2 snacks per day.  Aim for no more than 5 hours between eating.  If you eat breakfast, please do so within one hour of getting up.   Each meal should contain half fruits/vegetables, one quarter protein, and one quarter carbs (no bigger than a computer mouse)  Cut down on sweet beverages. This includes juice, soda, and sweet tea.   Drink at least 1 glass of water with each meal and aim for at least 8 glasses per day  Exercise at least 150 minutes every week.

## 2022-11-02 NOTE — Progress Notes (Signed)
   Jeffery Le is a 37 y.o. male who presents today for an office visit.  Assessment/Plan:  Abdominal Pain No red flags.  Reassuring exam.  Sounds like he is having some underlying constipation based on history.  We did discuss checking a CT scan to further evaluate especially with his issues with testicular pain and back pain from a couple weeks ago however we will try course of laxative first to see if this improves his symptoms.  Reassured patient that he has no significant signs or symptoms concerning for acute intra-abdominal process such as appendicitis.  He will start MiraLAX daily for the next few days.  If symptoms do not improve we will need to get CT scan at that time.    Subjective:  HPI:  Patient here with low back and abdominal pain.  This started a few days ago.  We did see him a couple weeks ago with testicular pain.  We ordered an ultrasound which was completed yesterday.  There was some concern for hernia on his exam however his ultrasound yesterday did not show any significant abnormalities with testicles or any obvious hernias.  Over the last few days he has had worsening bilateral low back pain as well as right lower quadrant pain.  He is worried about appendicitis.  He did go to urgent care 2 days ago and was diagnosed with muscular strain.  He was discharged home.  No nausea or vomiting.  Appetite has been decreased.  No fevers or chills.  No penile discharge.  No dysuria.  No hematuria.  He has noticed a little more frequent bowel movements.  He has also noticed hard pellets when passing stool.  This has been going on for the last few days.  Sometimes does not feel like he is completely emptying his bowels.  No melena.  No hematochezia.       Objective:  Physical Exam: BP 119/71   Pulse 60   Temp 97.8 F (36.6 C) (Oral)   Ht 5\' 9"  (1.753 m)   Wt 159 lb (72.1 kg)   SpO2 99%   BMI 23.48 kg/m   Gen: No acute distress, resting comfortably CV: Regular rate and  rhythm with no murmurs appreciated Pulm: Normal work of breathing, clear to auscultation bilaterally with no crackles, wheezes, or rhonchi Abdomen: Bowel sounds present, soft, nondistended.  Mild tenderness palpation along bilateral lower quadrants.  No rebound or guarding. Neuro: Grossly normal, moves all extremities Psych: Normal affect and thought content      Baylynn Shifflett M. [name], MD 11/02/2022 12:51 PM

## 2022-11-02 NOTE — Progress Notes (Signed)
Discussed with patient today at his office visit.  Reassuring ultrasound without any significant abnormalities.

## 2022-11-03 ENCOUNTER — Ambulatory Visit: Payer: BC Managed Care – PPO | Admitting: Family Medicine

## 2022-11-04 ENCOUNTER — Ambulatory Visit: Payer: BC Managed Care – PPO | Admitting: Family Medicine

## 2022-11-05 ENCOUNTER — Encounter: Payer: Self-pay | Admitting: Sports Medicine

## 2022-11-05 ENCOUNTER — Encounter: Payer: Self-pay | Admitting: Family Medicine

## 2022-11-05 DIAGNOSIS — M546 Pain in thoracic spine: Secondary | ICD-10-CM | POA: Diagnosis not present

## 2022-11-05 DIAGNOSIS — J9811 Atelectasis: Secondary | ICD-10-CM | POA: Diagnosis not present

## 2022-11-05 DIAGNOSIS — R0781 Pleurodynia: Secondary | ICD-10-CM | POA: Diagnosis not present

## 2022-11-05 DIAGNOSIS — I517 Cardiomegaly: Secondary | ICD-10-CM | POA: Diagnosis not present

## 2022-11-05 DIAGNOSIS — R109 Unspecified abdominal pain: Secondary | ICD-10-CM | POA: Diagnosis not present

## 2022-11-05 DIAGNOSIS — N3289 Other specified disorders of bladder: Secondary | ICD-10-CM | POA: Diagnosis not present

## 2022-11-05 DIAGNOSIS — R630 Anorexia: Secondary | ICD-10-CM | POA: Diagnosis not present

## 2022-11-05 DIAGNOSIS — R1031 Right lower quadrant pain: Secondary | ICD-10-CM | POA: Diagnosis not present

## 2022-11-05 NOTE — Telephone Encounter (Signed)
Patient requests to be called for status of request/if unable to call, Patient requests reply to [facility] Message

## 2022-11-06 DIAGNOSIS — N3289 Other specified disorders of bladder: Secondary | ICD-10-CM | POA: Diagnosis not present

## 2022-11-06 DIAGNOSIS — R0781 Pleurodynia: Secondary | ICD-10-CM | POA: Diagnosis not present

## 2022-11-06 DIAGNOSIS — J9811 Atelectasis: Secondary | ICD-10-CM | POA: Diagnosis not present

## 2022-11-08 NOTE — Telephone Encounter (Signed)
Please advise 

## 2022-11-08 NOTE — Telephone Encounter (Signed)
Bladder wall thickening is a nonspecific finding however we should probably check a urinalysis and urine culture to rule out UTI or other possible causes.  Recommend he schedule appointment to get this done.

## 2022-11-09 NOTE — Telephone Encounter (Signed)
See note

## 2022-11-10 ENCOUNTER — Ambulatory Visit: Payer: BC Managed Care – PPO | Admitting: Sports Medicine

## 2022-11-15 ENCOUNTER — Encounter: Payer: Self-pay | Admitting: Family Medicine

## 2022-11-15 ENCOUNTER — Ambulatory Visit: Payer: BC Managed Care – PPO | Admitting: Family Medicine

## 2022-11-15 VITALS — BP 109/71 | HR 51 | Temp 97.8°F | Ht 69.0 in | Wt 156.4 lb

## 2022-11-15 DIAGNOSIS — N3289 Other specified disorders of bladder: Secondary | ICD-10-CM | POA: Diagnosis not present

## 2022-11-15 DIAGNOSIS — R059 Cough, unspecified: Secondary | ICD-10-CM | POA: Diagnosis not present

## 2022-11-15 DIAGNOSIS — R1030 Lower abdominal pain, unspecified: Secondary | ICD-10-CM | POA: Diagnosis not present

## 2022-11-15 DIAGNOSIS — B079 Viral wart, unspecified: Secondary | ICD-10-CM

## 2022-11-15 LAB — MICROALBUMIN / CREATININE URINE RATIO
Creatinine,U: 106.7 mg/dL
Microalb Creat Ratio: 0.7 mg/g (ref 0.0–30.0)
Microalb, Ur: <0.7 mg/dL (ref 0.0–1.9)

## 2022-11-15 NOTE — Progress Notes (Signed)
Jeffery Le is a 37 y.o. male who presents today for an office visit.  Assessment/Plan:  New/Acute Problems: Bladder Wall Thickening / Abdominal Pain No red flags.  May represent UTI or cystitis.  We will check UA and urine culture today.  If positive we will treat accordingly.  If negative will need referral to urology.  Prefers to be seen [facility] due to location.  Cutaneous wart Cryotherapy applied today.  Tolerated well.  Chronic Problems Addressed Today: Cough No red flags.  Symptoms only occur with long distance runs of about 10 miles.  May have exercise-induced asthma.  We did discuss trial of albuterol however he would like to defer this for now.  Will continue with watchful waiting.  We discussed reasons to return to care.     Subjective:  HPI:  See A/P for status of chronic conditions.  Patient is here today for follow-up.  He has been here few times over the last few weeks with abdominal pain.  Most recently saw him 13 days ago.  There was concern for constipation.  We did discuss getting a CT scan at that point however he deferred.  We recommended conservative management with MiraLAX.  Symptoms worsened and he ended up going to the ED a few days later.  In the ED had workup including labs and CT scan.  CT scan was notable for diffuse urinary bladder wall thickening concerning for possible cystitis but this otherwise negative.  Over the last few days he is still having some pain in lower abdomen. No dysuria. No hematuria. Sometimes has right flank pain. No fevers or chills.  No nausea or vomiting.  He has also noticed more cough when running long distances.  Does not have any symptoms at baseline.Starts getting symptoms after about 10 miles or so.  Has a strong family history of asthma.  He has never been diagnosed with asthma.  Sometimes has difficulty getting a deep breath when he is on a long run.  He has also noticed a lesion on right fourth finger.  Started  a few days ago.  Concerned about a wart.  He has had these before which have been treated with at home therapies.       Objective:  Physical Exam: BP 109/71   Pulse (!) 51   Temp 97.8 F (36.6 C) (Temporal)   Ht 5\' 9"  (1.753 m)   Wt 156 lb 6.4 oz (70.9 kg)   SpO2 99%   BMI 23.10 kg/m   Gen: No acute distress, resting comfortably Skin: Keratotic lesion approximately 2 mm in diameter on distal phalynx of the volar right fourth digit. Neuro: Grossly normal, moves all extremities Psych: Normal affect and thought content  Cryotherapy Procedure Note  Pre-operative Diagnosis: cutaneous wart  Locations: Right Finger  Indications: Therapeutic  Procedure Details  Patient informed of risks (permanent scarring, infection, light or dark discoloration, bleeding, infection, weakness, numbness and recurrence of the lesion) and benefits of the procedure and verbal informed consent obtained.  The areas are treated with liquid nitrogen therapy, frozen until ice ball extended 3 mm beyond lesion, allowed to thaw, and treated again. The patient tolerated procedure well.  The patient was instructed on post-op care, warned that there may be blister formation, redness and pain. Recommend OTC analgesia as needed for pain.  Condition: Stable  Complications: none.   Time Spent: 30 minutes of total time was spent on the date of the encounter performing the following actions: chart review prior  to seeing the patient including recent Emergency Department visit, obtaining history, performing a medically necessary exam, counseling on the treatment plan, placing orders, and documenting in our EHR.        [name]. [name], MD 11/15/2022 10:00 AM

## 2022-11-15 NOTE — Progress Notes (Deleted)
   Jeffery Le D.[facility] [address] [place] [address] Phone: [phone]   Assessment and Plan:     There are no diagnoses linked to this encounter.  *** - Patient has received relief with OMT in the past.  Elects for repeat OMT today.  Tolerated well per note below. - Decision today to treat with OMT was based on Physical Exam   After verbal consent patient was treated with HVLA (high velocity low amplitude), ME (muscle energy), FPR (flex positional release), ST (soft tissue), PC/PD (Pelvic Compression/ Pelvic Decompression) techniques in cervical, rib, thoracic, lumbar, and pelvic areas. Patient tolerated the procedure well with improvement in symptoms.  Patient educated on potential side effects of soreness and recommended to rest, hydrate, and use Tylenol as needed for pain control.   Pertinent previous records reviewed include ***   Follow Up: ***     Subjective:   I, Kylar Speelman, am serving as a Neurosurgeon for Doctor Richardean Sale   Chief Complaint: MSK    HPI:    10/14/2022 Patient is a 37 year old male complaining of right knee pain . Patient states he has right knee pain for years, medial knee pain , pain is just there, right foot sore and tight in the morning thinks its plantar fascitis from the arch to the heel , and a back reset, this is all preventative he and his wife has a race in January, tylenol intermittently doesn't know if it helps   11/22/2022 Patient states    Relevant Historical Information: None pertinent Additional pertinent review of systems negative.  Current Outpatient Medications  Medication Sig Dispense Refill   ketoconazole (NIZORAL) 2 % cream Apply 1 Application topically 2 (two) times daily. 60 g 0   meloxicam (MOBIC) 15 MG tablet Take 1 tablet (15 mg total) by mouth daily. 30 tablet 0   Multiple Vitamin (MULTIVITAMIN WITH MINERALS) TABS Take 1 tablet by mouth daily.     naproxen sodium (ALEVE) 220 MG tablet  Take 220 mg by mouth.     No current facility-administered medications for this visit.      Objective:     There were no vitals filed for this visit.    There is no height or weight on file to calculate BMI.    Physical Exam:     General: Well-appearing, cooperative, sitting comfortably in no acute distress.   OMT Physical Exam:  ASIS Compression Test: Positive Right Cervical: TTP paraspinal, *** Rib: Bilateral elevated first rib with TTP Thoracic: TTP paraspinal,*** Lumbar: TTP paraspinal,*** Pelvis: Right anterior innominate  Electronically signed by:  Jeffery Le D.[facility] 7:21 AM 11/15/22

## 2022-11-15 NOTE — Assessment & Plan Note (Signed)
No red flags.  Symptoms only occur with long distance runs of about 10 miles.  May have exercise-induced asthma.  We did discuss trial of albuterol however he would like to defer this for now.  Will continue with watchful waiting.  We discussed reasons to return to care.

## 2022-11-15 NOTE — Patient Instructions (Signed)
It was very nice to see you today!  Will check a urine sample today.  If you have UTI will treat this.  If not we will need to have you see a urologist.  We froze your today.  Please let us know if your breathing while running gets worse.  Return if symptoms worsen or fail to improve.   Take care, Dr [name]  PLEASE NOTE:  If you had any lab tests, please let us know if you have not heard back within a few days. You may see your results on mychart before we have a chance to review them but we will give you a call once they are reviewed by [place].   If we ordered any referrals today, please let us know if you have not heard from their office within the next week.   If you had any urgent prescriptions sent in today, please check with the pharmacy within an hour of our visit to make sure the prescription was transmitted appropriately.   Please try these tips to maintain a healthy lifestyle:  Eat at least 3 REAL meals and 1-2 snacks per day.  Aim for no more than 5 hours between eating.  If you eat breakfast, please do so within one hour of getting up.   Each meal should contain half fruits/vegetables, one quarter protein, and one quarter carbs (no bigger than a computer mouse)  Cut down on sweet beverages. This includes juice, soda, and sweet tea.   Drink at least 1 glass of water with each meal and aim for at least 8 glasses per day  Exercise at least 150 minutes every week.

## 2022-11-16 ENCOUNTER — Other Ambulatory Visit: Payer: Self-pay | Admitting: *Deleted

## 2022-11-16 ENCOUNTER — Telehealth: Payer: Self-pay | Admitting: *Deleted

## 2022-11-16 DIAGNOSIS — R1030 Lower abdominal pain, unspecified: Secondary | ICD-10-CM

## 2022-11-16 NOTE — Telephone Encounter (Signed)
Pt has been scheduled for lab on 6/27 @ 11 am.

## 2022-11-16 NOTE — Progress Notes (Signed)
Please make sure he has a urine culture sent. His urine microalbumin creatine ratio was normal.  Jeffery Spirito M. [name], MD 11/16/2022 9:02 AM

## 2022-11-16 NOTE — Telephone Encounter (Signed)
Left message to return call to our office at their convenience.   Due to lab error, we need to collect another urine sample  Please schedule lab appt when patient return call

## 2022-11-18 ENCOUNTER — Other Ambulatory Visit: Payer: BC Managed Care – PPO

## 2022-11-19 ENCOUNTER — Encounter: Payer: Self-pay | Admitting: Family Medicine

## 2022-11-19 ENCOUNTER — Ambulatory Visit: Payer: BC Managed Care – PPO | Admitting: Family Medicine

## 2022-11-19 ENCOUNTER — Other Ambulatory Visit: Payer: BC Managed Care – PPO

## 2022-11-19 VITALS — BP 110/66 | HR 70 | Temp 98.0°F | Ht 69.0 in | Wt 158.5 lb

## 2022-11-19 DIAGNOSIS — B084 Enteroviral vesicular stomatitis with exanthem: Secondary | ICD-10-CM

## 2022-11-19 DIAGNOSIS — J029 Acute pharyngitis, unspecified: Secondary | ICD-10-CM

## 2022-11-19 DIAGNOSIS — R1030 Lower abdominal pain, unspecified: Secondary | ICD-10-CM | POA: Diagnosis not present

## 2022-11-19 LAB — POCT RAPID STREP A (OFFICE): Rapid Strep A Screen: NEGATIVE

## 2022-11-19 NOTE — Progress Notes (Signed)
   Jeffery Le is a 37 y.o. male who presents today for an office visit.  Assessment/Plan:  Hand-foot-and-mouth disease No red flags.  Rapid strep negative.  Discussed conservative management.  He can continue Tylenol and ibuprofen.  Encouraged hydration.  We discussed reasons to return to care.  Follow-up as needed.  Abdominal pain/bladder wall thickening No real change in symptoms since our last visit.  We did check urine culture a week ago however there was a lab issue and this was not drawn.  We are recollecting today.  If negative for UTI will need to be referred to urology.   Subjective:  HPI:  Patient here with sore throat.  Started yesterday.  Fever of 101.5 yesterday.  Son recently diagnosed with hand-foot-and-mouth disease. He is starting to get lesions on hands, feet, and mouth the last day or so.  Fever has resolved.  Rash is itchy.       Objective:  Physical Exam: BP 110/66 (BP Location: Left Arm, Patient Position: Sitting, Cuff Size: Normal)   Pulse 70   Temp 98 F (36.7 C) (Temporal)   Ht 5\' 9"  (1.753 m)   Wt 158 lb 8 oz (71.9 kg)   SpO2 98%   BMI 23.41 kg/m   Gen: No acute distress, resting comfortably Skin: Several scattered maculopapular vesicular lesions on hands and feet. Neuro: Grossly normal, moves all extremities Psych: Normal affect and thought content      Jeffery Le M. [name], MD 11/19/2022 12:22 PM

## 2022-11-19 NOTE — Patient Instructions (Addendum)
It was very nice to see you today!  You have .hand-foot-and-mouth.  Strep is negative.  This should plan its own over the next several days.  You can use Tylenol and ibuprofen as needed.  Make sure that you are getting plenty of fluids.  Return if symptoms worsen or fail to improve.   Take care, Dr [name]  PLEASE NOTE:  If you had any lab tests, please let us know if you have not heard back within a few days. You may see your results on mychart before we have a chance to review them but we will give you a call once they are reviewed by [place].   If we ordered any referrals today, please let us know if you have not heard from their office within the next week.   If you had any urgent prescriptions sent in today, please check with the pharmacy within an hour of our visit to make sure the prescription was transmitted appropriately.   Please try these tips to maintain a healthy lifestyle:  Eat at least 3 REAL meals and 1-2 snacks per day.  Aim for no more than 5 hours between eating.  If you eat breakfast, please do so within one hour of getting up.   Each meal should contain half fruits/vegetables, one quarter protein, and one quarter carbs (no bigger than a computer mouse)  Cut down on sweet beverages. This includes juice, soda, and sweet tea.   Drink at least 1 glass of water with each meal and aim for at least 8 glasses per day  Exercise at least 150 minutes every week.

## 2022-11-20 LAB — URINE CULTURE
MICRO NUMBER:: 15140772
Result:: NO GROWTH
SPECIMEN QUALITY:: ADEQUATE

## 2022-11-21 ENCOUNTER — Encounter: Payer: Self-pay | Admitting: Family Medicine

## 2022-11-22 ENCOUNTER — Ambulatory Visit: Payer: BC Managed Care – PPO | Admitting: Sports Medicine

## 2022-11-22 NOTE — Telephone Encounter (Signed)
He can go back if he has been fever free for 24 hours and he is not getting any new skin lesions or other symptoms.  [name]. [name], MD 11/22/2022 10:07 AM

## 2022-11-22 NOTE — Progress Notes (Signed)
Urine culture is negative. Recommend referral to urology.  [name]. [name], MD 11/22/2022 7:33 AM

## 2022-11-22 NOTE — Telephone Encounter (Signed)
Pt has a follow up appt scheduled on 11/23/22.  Patient Name First: Jeffery Last: Le Gender: Male DOB: 02-28-1986 Age: 37 Y 8 M 13 D Return Phone Number: [phone] (Primary) Address: City/ State/ Zip: [place] [address] Client [facility] - Human resources [facility] [employer] Provider Jacquiline Doe- MD Contact Type Call Who Is Calling Patient / Member / Family / Caregiver Call Type Triage / Clinical Relationship To Patient Self Return Phone Number [phone] (Primary) Chief Complaint Rash - Widespread Reason for Call Symptomatic / Request for Health Information Initial Comment Caller states he diagnosed with hand foot and mouth, Wants to know when he can return to normal contact. Has a rash and blisters. Translation No Nurse Assessment Nurse: Emelda Fear, RN, Magda Paganini Date/Time (Eastern Time): 11/21/2022 9:18:33 PM Confirm and document reason for call. If symptomatic, describe symptoms. ---Caller states he has hand foot and mouth disease. Reports his son had it last week. Reports on Thursday, he was dx with hand foot and mouth. Reports he has rash and blisters on hands and feet. Reports sxs are improving and denies any new or worsening sxs. Wondering when it is appropriate to go back in public. Does the patient have any new or worsening symptoms? ---No Please document clinical information provided and list any resource used. ---Informed caller that he can return to work as long as he is feeling better and does not have a fever. Also advised that if he has any open or weeping blisters it is advised not to return for 7-10 days. Verbalizes understanding. Disp. Time [name] Time) Disposition Final User 11/21/2022 9:30:59 PM Clinical Call Yes Emelda Fear, RN, Magda Paganini Final Disposition 11/21/2022 9:30:59 PM Clinical Call Yes Emelda Fear, RN, Magda Paganini

## 2022-11-23 ENCOUNTER — Encounter: Payer: Self-pay | Admitting: Family Medicine

## 2022-11-23 ENCOUNTER — Other Ambulatory Visit: Payer: Self-pay | Admitting: *Deleted

## 2022-11-23 ENCOUNTER — Ambulatory Visit: Payer: BC Managed Care – PPO | Admitting: Family Medicine

## 2022-11-23 VITALS — BP 121/81 | HR 60 | Temp 97.7°F | Ht 69.0 in | Wt 156.0 lb

## 2022-11-23 DIAGNOSIS — N3289 Other specified disorders of bladder: Secondary | ICD-10-CM

## 2022-11-23 DIAGNOSIS — B084 Enteroviral vesicular stomatitis with exanthem: Secondary | ICD-10-CM | POA: Diagnosis not present

## 2022-11-23 NOTE — Patient Instructions (Addendum)
It was very nice to see you today!  It is ok for you to return to work.  Will refer you to the urologist.  Please let us know if your symptoms change.  Return if symptoms worsen or fail to improve.   Take care, Dr [name]  PLEASE NOTE:  If you had any lab tests, please let us know if you have not heard back within a few days. You may see your results on mychart before we have a chance to review them but we will give you a call once they are reviewed by [place].   If we ordered any referrals today, please let us know if you have not heard from their office within the next week.   If you had any urgent prescriptions sent in today, please check with the pharmacy within an hour of our visit to make sure the prescription was transmitted appropriately.   Please try these tips to maintain a healthy lifestyle:  Eat at least 3 REAL meals and 1-2 snacks per day.  Aim for no more than 5 hours between eating.  If you eat breakfast, please do so within one hour of getting up.   Each meal should contain half fruits/vegetables, one quarter protein, and one quarter carbs (no bigger than a computer mouse)  Cut down on sweet beverages. This includes juice, soda, and sweet tea.   Drink at least 1 glass of water with each meal and aim for at least 8 glasses per day  Exercise at least 150 minutes every week.

## 2022-11-23 NOTE — Progress Notes (Signed)
   Jeffery Le is a 37 y.o. male who presents today for an office visit.  Assessment/Plan:  Hand Foot Mouth Disease  Patient is now on day 7 of illness and has had resolution of fever for several days and rash does seem to be improving.  Even though he does still have some lesions on hand and feet it is okay for him to go back to work at this point as long as symptoms continue to improve.  Will give work note saying he can go back tomorrow.  He will let us know if any new issues arise  Abdominal Pain / Bladder Wall Thickening  Urine culture was negative.  He has not had much abdominal pain the last few days while he has been dealing with hand-foot-and-mouth disease but he is not sure if his current also is just masking symptoms.  Will are referring to urology for further evaluation.    Subjective:  HPI:  Patient here for follow up.  He was seen here 5 days ago for hand-foot-and-mouth disease.  We discussed conservative management.  He does admit he has had a rough few days but symptoms do seem to be improving.  He has not had any new lesions pop up for last couple days.  He has rash involving hands, feet, and face.  Has not had any fever for several days.  He is going back to work tomorrow and wants to be cleared to go back to work.  Overall he feels fine now.  First  symptoms were 7 days ago.        Objective:  Physical Exam: BP 121/81   Pulse 60   Temp 97.7 F (36.5 C) (Temporal)   Ht 5\' 9"  (1.753 m)   Wt 156 lb (70.8 kg)   SpO2 98%   BMI 23.04 kg/m   Gen: No acute distress, resting comfortably Skin: Several scattered maculopapular vesicular lesions on hands, feet, and perioral area. Neuro: Grossly normal, moves all extremities Psych: Normal affect and thought content      Jeffery Gammon M. [name], MD 11/23/2022 8:56 AM

## 2023-03-22 DIAGNOSIS — B354 Tinea corporis: Secondary | ICD-10-CM | POA: Diagnosis not present

## 2023-03-26 DIAGNOSIS — R509 Fever, unspecified: Secondary | ICD-10-CM | POA: Diagnosis not present

## 2023-03-26 DIAGNOSIS — R051 Acute cough: Secondary | ICD-10-CM | POA: Diagnosis not present

## 2023-03-26 DIAGNOSIS — J069 Acute upper respiratory infection, unspecified: Secondary | ICD-10-CM | POA: Diagnosis not present

## 2023-04-08 ENCOUNTER — Ambulatory Visit: Payer: BC Managed Care – PPO | Admitting: Family Medicine

## 2023-04-08 DIAGNOSIS — J069 Acute upper respiratory infection, unspecified: Secondary | ICD-10-CM | POA: Diagnosis not present

## 2023-04-08 DIAGNOSIS — B9689 Other specified bacterial agents as the cause of diseases classified elsewhere: Secondary | ICD-10-CM | POA: Diagnosis not present

## 2023-04-13 DIAGNOSIS — H25042 Posterior subcapsular polar age-related cataract, left eye: Secondary | ICD-10-CM | POA: Diagnosis not present

## 2023-05-09 ENCOUNTER — Encounter: Payer: Self-pay | Admitting: Family Medicine

## 2023-05-09 ENCOUNTER — Ambulatory Visit: Payer: BC Managed Care – PPO | Admitting: Family Medicine

## 2023-05-09 VITALS — BP 118/70 | HR 58 | Temp 97.7°F | Ht 69.0 in | Wt 163.2 lb

## 2023-05-09 DIAGNOSIS — M79604 Pain in right leg: Secondary | ICD-10-CM

## 2023-05-09 DIAGNOSIS — N3289 Other specified disorders of bladder: Secondary | ICD-10-CM | POA: Diagnosis not present

## 2023-05-09 DIAGNOSIS — R1031 Right lower quadrant pain: Secondary | ICD-10-CM

## 2023-05-09 LAB — URINALYSIS, ROUTINE W REFLEX MICROSCOPIC
Bilirubin Urine: NEGATIVE
Hgb urine dipstick: NEGATIVE
Ketones, ur: NEGATIVE
Leukocytes,Ua: NEGATIVE
Nitrite: NEGATIVE
RBC / HPF: NONE SEEN (ref 0–?)
Specific Gravity, Urine: 1.01 (ref 1.000–1.030)
Total Protein, Urine: NEGATIVE
Urine Glucose: NEGATIVE
Urobilinogen, UA: 0.2 (ref 0.0–1.0)
pH: 6 (ref 5.0–8.0)

## 2023-05-09 NOTE — Patient Instructions (Signed)
It was very nice to see you today!  I will refer you to see urology for your bladder wall thickening.  I will also order a CT scan.  Please schedule this if you cannot see urology soon.  Will check urine sample today.  Work on the exercises for your shinsplints.   Return if symptoms worsen or fail to improve.   Take care, Dr [name]  PLEASE NOTE:  If you had any lab tests, please let us know if you have not heard back within a few days. You may see your results on mychart before we have a chance to review them but we will give you a call once they are reviewed by [place].   If we ordered any referrals today, please let us know if you have not heard from their office within the next week.   If you had any urgent prescriptions sent in today, please check with the pharmacy within an hour of our visit to make sure the prescription was transmitted appropriately.   Please try these tips to maintain a healthy lifestyle:  Eat at least 3 REAL meals and 1-2 snacks per day.  Aim for no more than 5 hours between eating.  If you eat breakfast, please do so within one hour of getting up.   Each meal should contain half fruits/vegetables, one quarter protein, and one quarter carbs (no bigger than a computer mouse)  Cut down on sweet beverages. This includes juice, soda, and sweet tea.   Drink at least 1 glass of water with each meal and aim for at least 8 glasses per day  Exercise at least 150 minutes every week.    Sports Hernia Rehab Ask your health care provider which exercises are safe for you. Do exercises exactly as told by your health care provider and adjust them as directed. It is normal to feel mild stretching, pulling, tightness, or discomfort as you do these exercises. Stop right away if you feel sudden pain or your pain gets worse. Do not begin these exercises until told by your health care provider. Stretching and range-of-motion exercise This exercise warms up your muscles and joints and  improves the movement and flexibility around your hip and pelvis. Seated stretch This exercise is sometimes called hamstrings and adductors stretch. Sit on the floor with your legs stretched wide. Keep your knees straight during this exercise. Keeping your head and back in a straight line, bend at your waist to reach for your left foot (position A). You should feel a stretch in your right inner thigh (adductors). Hold this position for __________ seconds. Then slowly return to the upright position. Keeping your head and back in a straight line, bend at your waist to reach forward (position B). You should feel a stretch behind both of your thighs or knees (hamstrings). Hold this position for __________ seconds. Then slowly return to the upright position. Keeping your head and back in a straight line, bend at your waist to reach for your right foot (position C). You should feel a stretch in your left inner thigh (adductors). Hold this position for __________ seconds. Then slowly return to the upright position. Repeat __________ times. Complete this exercise __________ times a day. Strengthening exercises These exercises build strength around your hip and pelvis. Hip adduction, isometric This exercise is sometimes called inner thigh squeeze. Sit on a firm chair that positions your knees at about the same height as your hips. Place a large ball, firm pillow, or rolled-up  bath towel between your thighs. Squeeze your thighs together without moving your legs (isometric adduction), gradually building tension. Hold this position for __________ seconds. Let your muscles relax completely before you repeat this exercise. Repeat __________ times. Complete this exercise __________ times a day. Hip abduction, isometric Sit on a firm chair. Your knees should be at about the same height as your hips. Place one of your hands on the outside of each of your thighs, just above your knees. Push your thighs away  from each other, against your hands, so that little movement takes place (isometric abduction). Gradually build muscle tension. Hold this position for __________ seconds. Let your muscles relax completely before you repeat this exercise. Repeat __________ times. Complete this exercise __________ times a day. Pelvic tilt This exercise strengthens the muscles that lie deep in the abdomen. Lie on your back on a firm bed or the floor with your legs extended. Bend your knees so they are pointing toward the ceiling and your feet are flat on the floor. Tighten your lower abdominal muscles to press your lower back against the floor. This motion will tilt your pelvis so your tailbone points up toward the ceiling instead of pointing to your feet or the floor. To help with this exercise, you may place a small towel under your lower back and try to push your back into the towel. Hold this position for __________ seconds. Let your muscles relax completely before you repeat this exercise. Repeat __________ times. Complete this exercise __________ times a day. Hip adduction, isotonic This exercise is sometimes called side-lying straight leg raises. Lie on your side so your head, shoulder, hip, and knee are in a straight line with each other. To help maintain your balance, you may put the foot of your top leg in front of the leg that is on the floor. Your left / right leg should be on the bottom. Roll your hips slightly forward so your hips are stacked directly over each other and your left / right knee is facing forward. Tense the muscles of your inner thigh and lift your bottom leg 4-6 inches (10-15 cm). Do not roll your body forward or backward. Hold this position for __________ seconds. Slowly return to the starting position. Repeat __________ times. Complete this exercise __________ times a day. This information is not intended to replace advice given to you by your health care provider. Make sure you  discuss any questions you have with your health care provider. Document Revised: 08/15/2020 Document Reviewed: 08/15/2020 Elsevier Patient Education  2024 [employer].

## 2023-05-09 NOTE — Progress Notes (Signed)
Jeffery Le is a 37 y.o. male who presents today for an office visit.  Assessment/Plan:  Bladder Wall Thickening  We did refer him to urology several months ago for this however when he called to schedule an appointment a couple weeks ago they told him would be about 3 more months before he can be seen.  Will place referral again today for him to see an office closer to his house.  Will also check UA and urine culture.  We did discuss repeat CT scan.  We will order this today however he can hold off on this if he able to see urology relatively quickly.  We discussed reasons to return to care  Right lower quadrant pain We are working up as above bladder wall thickening as above which may be contributing.  He did have further workup several months ago including ultrasound and CT scan which did not show any signs of hernia.  Will be repeating CT scan as above.  We did discuss that his pain may be related to sports hernia as well.  If above workup is negative would consider referral to PT or sports medicine for this.  Right leg pain Consistent with shin splints likely related to overuse injury related to training for marathon.  We did discuss decreasing activity levels.  Also discussed home exercise program and handout given.  He will let us know if not proving and we can refer to PT or sports medicine.   Chronic Problems Addressed Today: No problem-specific Assessment & Plan notes found for this encounter.     Subjective:  HPI:  See A/P for status of chronic conditions.  Patient is here with lower abdominal/pelvic pain.  This has been going on for several months.  We last saw him about 6 months ago.  At that time we discussed recent abdominal CT scan that was performed in the emergency department which did show bladder wall thickening.  We referred him to urology at that time.  Symptoms improved and he did not schedule an appointment to see them.  Unfortunately never last few weeks  symptoms have returned.  He did call to schedule an appoint with urology however they cannot see him for another 3 months or so.  Pain seems to be worse now than it was previously.  Primarily located in right lower quadrant and right pelvis near his groin.  He is having a little bit more urinary frequency but he attributes this to taking more liquids.  No dysuria.  No fevers or chills.  No further nausea or vomiting.  No reported constipation or diarrhea.  He has been training for a marathon but has not had any specific injuries or precipitating events.  He has had right leg pain for the last several days.  He thinks that this is due to shinsplints due to training for marathon.  Symptoms started after vigorously training within the last week.  Pain predominately located on outer aspect of his right leg.         Objective:  Physical Exam: BP 118/70 (BP Location: Left Arm, Patient Position: Sitting, Cuff Size: Normal)   Pulse (!) 58   Temp 97.7 F (36.5 C) (Temporal)   Ht 5\' 9"  (1.753 m)   Wt 163 lb 4 oz (74 kg)   SpO2 97%   BMI 24.11 kg/m   Gen: No acute distress, resting comfortably CV: Regular rate and rhythm with no murmurs appreciated Pulm: Normal work of breathing, clear to auscultation  bilaterally with no crackles, wheezes, or rhonchi Abdomen: Soft, nondistended.  Umbilical hernia noted.  Easily reducible.  Nonpainful.  Bowel sounds present.  Tenderness to palpation along right lower quadrant without any rebound or guarding. MUSCULOSKELETAL: Right lower leg without deformities.  Tenderness to palpation along lateral edge of tibia. Neuro: Grossly normal, moves all extremities Psych: Normal affect and thought content  Time Spent: 30 minutes of total time was spent on the date of the encounter performing the following actions: chart review prior to seeing the patient, obtaining history, performing a medically necessary exam, counseling on the treatment plan, placing orders, and  documenting in our EHR.        Jeffery Le. Jeffery Ralph, MD 05/09/2023 8:36 AM

## 2023-05-10 LAB — URINE CULTURE
MICRO NUMBER:: 15854949
Result:: NO GROWTH
SPECIMEN QUALITY:: ADEQUATE

## 2023-05-11 ENCOUNTER — Ambulatory Visit (INDEPENDENT_AMBULATORY_CARE_PROVIDER_SITE_OTHER): Payer: BC Managed Care – PPO

## 2023-05-11 ENCOUNTER — Ambulatory Visit: Payer: BC Managed Care – PPO | Admitting: Sports Medicine

## 2023-05-11 VITALS — BP 108/80 | HR 73 | Ht 69.0 in | Wt 161.0 lb

## 2023-05-11 DIAGNOSIS — M79604 Pain in right leg: Secondary | ICD-10-CM | POA: Diagnosis not present

## 2023-05-11 DIAGNOSIS — S86811A Strain of other muscle(s) and tendon(s) at lower leg level, right leg, initial encounter: Secondary | ICD-10-CM

## 2023-05-11 DIAGNOSIS — M79661 Pain in right lower leg: Secondary | ICD-10-CM | POA: Diagnosis not present

## 2023-05-11 MED ORDER — MELOXICAM 15 MG PO TABS: 15.0000 mg | ORAL_TABLET | Freq: Every day | ORAL | 0 refills | Status: AC

## 2023-05-11 NOTE — Progress Notes (Signed)
Urine test is normal.  He needs to follow-up with urology as we discussed at his visit.

## 2023-05-11 NOTE — Patient Instructions (Signed)
-   Start meloxicam 15 mg daily. May use remaining NSAID as needed once daily for pain control.  Do not to use additional over-the-counter NSAIDs (ibuprofen, naproxen, Advil, Aleve) while taking prescription NSAIDs.  May use Tylenol [phone] mg 2 to 3 times a day for breakthrough pain. Ant tib HEP Run as tolerated As needed follow up

## 2023-05-11 NOTE — Progress Notes (Signed)
[name] D.[facility] [address] [place] [address] Phone: [phone]   Assessment and Plan:     1. Right leg pain 2. Strain of right tibialis anterior muscle, initial encounter -Acute, initial sports medicine visit - Most consistent with strain of right tibialis anterior caused by patient's training for marathon - Differential includes stress reaction versus stress fracture, however it is reassuring that patient had No sign of stress reaction or fracture on x-ray imaging today, and no significant TTP to tibia or fibula -May continue training for marathon on 06/02/2023 as tolerated.  Stop workout if sharp pain returns.  Recommend flat and cushion training surfaces.  Continue to alternate between 2 pairs of running shoes - Start meloxicam 15 mg daily x4 weeks. Do not to use additional over-the-counter NSAIDs (ibuprofen, naproxen, Advil, Aleve) while taking prescription NSAIDs.  May use Tylenol [phone] mg 2 to 3 times a day for breakthrough pain. - Start HEP for ankle and anterior tibialis -X-ray obtained in clinic.  My interpretation: No acute fracture, or signs of stress reaction  Pertinent previous records reviewed include none  Follow Up: As needed if no improvement or worsening of symptoms.  Could consider prolonged rest, ultrasound, advanced imaging   Subjective:   I, Moenique Parris, am serving as a Neurosurgeon for Doctor Richardean Sale  Chief Complaint: right shin pain   HPI:   05/11/23 Patient is a 37 year old male with right shin pain. Patient states he has been training for a marathon. He has upped his mileage and weightlifting for about a year. Mile 14 is where he started feeling the pain this has been going on for about a month or so. Marathon is 06/02/2023. Tylenol for the pain and that helped a little. States there was swelling but that resolved. Has been icing and heating. No numbness or tingling. He has a constant throb of  pain. Wants to come up with a plan   Relevant Historical Information: None pertinent  Additional pertinent review of systems negative.   Current Outpatient Medications:    ketoconazole (NIZORAL) 2 % cream, Apply 1 Application topically 2 (two) times daily., Disp: 60 g, Rfl: 0   meloxicam (MOBIC) 15 MG tablet, Take 1 tablet (15 mg total) by mouth daily., Disp: 30 tablet, Rfl: 0   Multiple Vitamin (MULTIVITAMIN WITH MINERALS) TABS, Take 1 tablet by mouth daily., Disp: , Rfl:    naproxen sodium (ALEVE) 220 MG tablet, Take 220 mg by mouth., Disp: , Rfl:    Objective:     Vitals:   05/11/23 1444  BP: 108/80  Pulse: 73  SpO2: 98%  Weight: 161 lb (73 kg)  Height: 5\' 9"  (1.753 m)      Body mass index is 23.78 kg/m.    Physical Exam:    Gen: Appears well, nad, nontoxic and pleasant Psych: Alert and oriented, appropriate mood and affect Neuro: sensation intact, strength is 5/5 with df/pf/inv/ev, muscle tone wnl Skin: no susupicious lesions or rashes  Right shin/ankle:  No deformity, no swelling or effusion TTP moderately along anterior tibialis musculature between tibia and fibula, mildly along medial tibia NTTP over fibular head, lat mal, medial mal, achilles, navicular, base of 5th, ATFL, CFL, deltoid, calcaneous or midfoot ROM DF 30, PF 45, inv/ev intact Negative ant drawer, talar tilt, rotation test, squeeze test. Neg thompson No pain with resisted inversion or eversion  Mild anterior shin discomfort with resisted dorsiflexion  Electronically signed by:  Romeo Apple  Leo [facility] 4:48 PM 05/11/23

## 2023-05-14 ENCOUNTER — Encounter: Payer: Self-pay | Admitting: Sports Medicine

## 2023-05-26 ENCOUNTER — Encounter: Payer: Self-pay | Admitting: Urology

## 2023-05-26 ENCOUNTER — Ambulatory Visit: Payer: BC Managed Care – PPO | Admitting: Urology

## 2023-05-26 VITALS — BP 131/79 | HR 62 | Ht 69.0 in | Wt 160.0 lb

## 2023-05-26 DIAGNOSIS — R1031 Right lower quadrant pain: Secondary | ICD-10-CM | POA: Diagnosis not present

## 2023-05-26 LAB — URINALYSIS, ROUTINE W REFLEX MICROSCOPIC
Bilirubin, UA: NEGATIVE
Glucose, UA: NEGATIVE
Ketones, UA: NEGATIVE
Leukocytes,UA: NEGATIVE
Nitrite, UA: NEGATIVE
Protein,UA: NEGATIVE
RBC, UA: NEGATIVE
Specific Gravity, UA: 1.025 (ref 1.005–1.030)
Urobilinogen, Ur: 0.2 mg/dL (ref 0.2–1.0)
pH, UA: 6 (ref 5.0–7.5)

## 2023-05-26 NOTE — Progress Notes (Signed)
 Assessment: 1. Right inguinal pain     Plan: I personally reviewed the patient's chart including provider notes, lab and imaging results. His symptoms are likely due to some inflammation of the right inguinal ligament.  No obvious hernia palpated. Recommend nonsteroidal anti-inflammatory medication for approximately 30 days.  He is currently taking meloxicam . Return to office as needed.  Chief Complaint:  Chief Complaint  Patient presents with   Bladder wall thickening     History of Present Illness:  Jeffery Le is a 37 y.o. male who is seen in consultation from Kennyth Worth HERO, MD for evaluation of right inguinal pain for 6 months.  He has noted some intermittent discomfort in the right inguinal area with radiation to the right hip and lower back area.  This occurs 2-3 times per week and is very mild in severity.  The symptoms typically last 2-3 hours and resolve spontaneously.  He has been more active recently as he is training for a marathon.  He has not had any scrotal swelling or redness.  No history of scrotal trauma. He reports some frequency and nocturia 1-2 times per night due to his increased fluid intake.  No dysuria or gross hematuria.  Scrotal ultrasound from 11/01/2022 showed normal testes bilaterally.  He was seen in the emergency room at [facility] for evaluation of the lower abdominal pain. CT from 11/06/2022 showed normal kidneys without evidence of stones or obstruction, mild diffuse urinary bladder wall thickening. Urinalysis was unremarkable at that time.   Past Medical History:  Past Medical History:  Diagnosis Date   No pertinent past medical history     Past Surgical History:  Past Surgical History:  Procedure Laterality Date   ear tues Bilateral     Allergies:  Allergies  Allergen Reactions   Ceclor [Cefaclor]     Unknown    Cephalosporins Other (See Comments)    unknown    Family History:  Family History  Problem Relation Age of  Onset   Diabetes Other        Paternal side   Alzheimer's disease Other        Maternal side   Hyperlipidemia Father     Social History:  Social History   Tobacco Use   Smoking status: Former   Smokeless tobacco: Former    Types: Chew    Quit date: 2022  Vaping Use   Vaping status: Never Used  Substance Use Topics   Alcohol use: Yes   Drug use: No    Review of symptoms:  Constitutional:  Negative for unexplained weight loss, night sweats, fever, chills ENT:  Negative for nose bleeds, sinus pain, painful swallowing CV:  Negative for chest pain, shortness of breath, exercise intolerance, palpitations, loss of consciousness Resp:  Negative for cough, wheezing, shortness of breath GI:  Negative for nausea, vomiting, diarrhea, bloody stools GU:  Positives noted in HPI; otherwise negative for gross hematuria, dysuria, urinary incontinence Neuro:  Negative for seizures, poor balance, limb weakness, slurred speech Psych:  Negative for lack of energy, depression, anxiety Endocrine:  Negative for polydipsia, polyuria, symptoms of hypoglycemia (dizziness, hunger, sweating) Hematologic:  Negative for anemia, purpura, petechia, prolonged or excessive bleeding, use of anticoagulants  Allergic:  Negative for difficulty breathing or choking as a result of exposure to anything; no shellfish allergy; no allergic response (rash/itch) to materials, foods  Physical exam: BP 131/79   Pulse 62   Ht 5' 9 (1.753 m)   Wt 160 lb (72.6  kg)   BMI 23.63 kg/m  GENERAL APPEARANCE:  Well appearing, well developed, well nourished, NAD HEENT: Atraumatic, Normocephalic, oropharynx clear. NECK: Supple without lymphadenopathy or thyromegaly. LUNGS: Clear to auscultation bilaterally. HEART: Regular Rate and Rhythm without murmurs, gallops, or rubs. ABDOMEN: Soft, non-tender, No Masses. EXTREMITIES: Moves all extremities well.  Without clubbing, cyanosis, or edema. NEUROLOGIC:  Alert and oriented x 3,  normal gait, CN II-XII grossly intact.  MENTAL STATUS:  Appropriate. BACK:  Non-tender to palpation.  No CVAT SKIN:  Warm, dry and intact.   GU: Penis:  circumcised Meatus: Normal Scrotum: No edema or erythema; vas palpated bilaterally; no obvious hernia palpated; some tenderness to palpation of the right internal ring Testis: normal without masses bilateral Epididymis: normal   Results: U/A: negative

## 2023-06-01 ENCOUNTER — Telehealth: Payer: Self-pay | Admitting: Family Medicine

## 2023-06-01 NOTE — Telephone Encounter (Unsigned)
 Copied from CRM [id number]. Topic: General - Billing Inquiry >> Jun 01, 2023 11:02 AM Franky GRADE wrote: Reason for CRM: [place] CT tech at [facility] received an order for patient for a CT scan Abdomen and Pelvis; however, they are not able to schedule without an Authorization. He would like a call back [phone]. We can also speak with Sherrilyn Caldron who works in the CT department.

## 2023-06-10 NOTE — Progress Notes (Unsigned)
    [name] D.[facility] [address] [place] [address] Phone: [phone]   Assessment and Plan:     There are no diagnoses linked to this encounter.  ***   Pertinent previous records reviewed include ***    Follow Up: ***     Subjective:   I, Jeffery Le, am serving as a Neurosurgeon for Doctor Richardean Sale   Chief Complaint: right shin pain    HPI:    05/11/23 Patient is a 38 year old male with right shin pain. Patient states he has been training for a marathon. He has upped his mileage and weightlifting for about a year. Mile 14 is where he started feeling the pain this has been going on for about a month or so. Marathon is 06/02/2023. Tylenol for the pain and that helped a little. States there was swelling but that resolved. Has been icing and heating. No numbness or tingling. He has a constant throb of pain. Wants to come up with a plan   06/13/2023 Patient states   Relevant Historical Information: None pertinent    Additional pertinent review of systems negative.   Current Outpatient Medications:    ketoconazole (NIZORAL) 2 % cream, Apply 1 Application topically 2 (two) times daily. (Patient not taking: Reported on 05/26/2023), Disp: 60 g, Rfl: 0   meloxicam (MOBIC) 15 MG tablet, Take 1 tablet (15 mg total) by mouth daily., Disp: 30 tablet, Rfl: 0   Multiple Vitamin (MULTIVITAMIN WITH MINERALS) TABS, Take 1 tablet by mouth daily. (Patient not taking: Reported on 05/26/2023), Disp: , Rfl:    naproxen sodium (ALEVE) 220 MG tablet, Take 220 mg by mouth. (Patient not taking: Reported on 05/26/2023), Disp: , Rfl:    Objective:     There were no vitals filed for this visit.    There is no height or weight on file to calculate BMI.    Physical Exam:    ***   Electronically signed by:  [name] [facility] 7:23 AM 06/10/23

## 2023-06-13 ENCOUNTER — Ambulatory Visit: Payer: BC Managed Care – PPO | Admitting: Sports Medicine

## 2023-06-13 VITALS — BP 108/80 | HR 66 | Ht 69.0 in | Wt 163.0 lb

## 2023-06-13 DIAGNOSIS — G8929 Other chronic pain: Secondary | ICD-10-CM | POA: Diagnosis not present

## 2023-06-13 DIAGNOSIS — M25561 Pain in right knee: Secondary | ICD-10-CM | POA: Diagnosis not present

## 2023-06-13 DIAGNOSIS — S86811D Strain of other muscle(s) and tendon(s) at lower leg level, right leg, subsequent encounter: Secondary | ICD-10-CM

## 2023-06-13 DIAGNOSIS — M79604 Pain in right leg: Secondary | ICD-10-CM

## 2023-06-13 NOTE — Patient Instructions (Addendum)
Thank you for coming in today.  Recommend 2 weeks of rest from running and weightlifting activities.  After 2 weeks, Recommend gradual return to physical activity.  Start activity at 50% (speed, duration, reps, sets, intensity) and allow 24 hours to assess for worsening pain.  If 50% is well-tolerated, may increase next activity to 75%.  If 75% is well-tolerated, may increase next physical activity to 100%.  If any of these levels cause pain, recommend dropping down to previous level for an additional 2-3 attempts before advancing.  If either running or weightlifting activity causes a flare in her pain, would recommend stopping that activity for an additional 2 weeks and then repeat attempt to restart activity.  You can do this at weeks 2, 4, 6 and 8  Start physical therapy for running, knee, ankle, shin.  We have provided you with an external physical therapy referral.  I will message you over MyChart about a running physical therapist that you can reach out to.  Use Tylenol/NSAIDs as needed for pain relief  6-8 week follow up

## 2023-06-17 ENCOUNTER — Encounter: Payer: Self-pay | Admitting: Sports Medicine

## 2023-06-29 ENCOUNTER — Ambulatory Visit: Payer: BC Managed Care – PPO

## 2023-06-29 DIAGNOSIS — R102 Pelvic and perineal pain: Secondary | ICD-10-CM | POA: Diagnosis not present

## 2023-06-29 DIAGNOSIS — R1030 Lower abdominal pain, unspecified: Secondary | ICD-10-CM | POA: Diagnosis not present

## 2023-06-29 DIAGNOSIS — N3289 Other specified disorders of bladder: Secondary | ICD-10-CM

## 2023-06-29 MED ORDER — IOHEXOL 300 MG/ML  SOLN
100.0000 mL | Freq: Once | INTRAMUSCULAR | Status: AC | PRN
Start: 2023-06-29 — End: 2023-06-29
  Administered 2023-06-29: 100 mL via INTRAVENOUS

## 2023-07-04 ENCOUNTER — Encounter: Payer: Self-pay | Admitting: Family Medicine

## 2023-07-04 NOTE — Progress Notes (Signed)
[name] D.[facility] [address] [place] [address] Phone: [phone]   Assessment and Plan:     1. Chronic pain of right knee 2. Right foot pain 3. Strain of right tibialis anterior muscle, subsequent encounter  -Chronic with exacerbation, subsequent visit - Patient presents with multiple areas of musculoskeletal concerns with most significant concern for involving hypersensitivity and discomfort over dorsum of midfoot.  I suspect this is still related to strain of anterior tibialis muscle specifically at its distal insertion point and leading to midfoot pain. - Continue HEP - Recommend reaching out to physical therapist in [place] to establish care - Recommend evaluating footwear.  Footwear should be fitted, but not too tight or compressing dorsum of foot. - Recommend using Voltaren gel topically over areas of pain - May use meloxicam 15 mg daily as needed for pain relief.  Recommend limiting chronic NSAIDs to 1-2 doses per week - May use Tylenol for day-to-day pain relief - Continue running as tolerated  Pertinent previous records reviewed include none  Follow Up: As needed   Subjective:   I, Jeffery Le, am serving as a Neurosurgeon for Doctor Richardean Sale   Chief Complaint: right shin pain    HPI:    05/11/23 Patient is a 38 year old male with right shin pain. Patient states he has been training for a marathon. He has upped his mileage and weightlifting for about a year. Mile 14 is where he started feeling the pain this has been going on for about a month or so. Marathon is 06/02/2023. Tylenol for the pain and that helped a little. States there was swelling but that resolved. Has been icing and heating. No numbness or tingling. He has a constant throb of pain. Wants to come up with a plan    06/13/2023 Patient states that he is feeling was able to run marathon. He has a list of questions  07/05/2023 Patient states  that he thinks he has a pinched nerve on the top of his foot . He has a sharp pain when the blankets catch his toes.    Relevant Historical Information: None pertinent    Additional pertinent review of systems negative.   Current Outpatient Medications:    ketoconazole (NIZORAL) 2 % cream, Apply 1 Application topically 2 (two) times daily., Disp: 60 g, Rfl: 0   meloxicam (MOBIC) 15 MG tablet, Take 1 tablet (15 mg total) by mouth daily., Disp: 30 tablet, Rfl: 0   Multiple Vitamin (MULTIVITAMIN WITH MINERALS) TABS, Take 1 tablet by mouth daily., Disp: , Rfl:    naproxen sodium (ALEVE) 220 MG tablet, Take 220 mg by mouth., Disp: , Rfl:    Objective:     Vitals:   07/05/23 1441  BP: 110/78  Pulse: 85  SpO2: 97%  Weight: 168 lb (76.2 kg)  Height: 5\' 9"  (1.753 m)      Body mass index is 24.81 kg/m.    Physical Exam:    Gen: Appears well, nad, nontoxic and pleasant Psych: Alert and oriented, appropriate mood and affect Neuro: sensation intact, strength is 5/5 with df/pf/inv/ev, muscle tone wnl Skin: no susupicious lesions or rashes  Right foot/ankle:  No deformity, no swelling or effusion Hypersensitivity with light palpation over dorsum of midfoot NTTP over fibular head, lat mal, medial mal, achilles, navicular, base of 5th, ATFL, CFL, deltoid, calcaneous  ROM DF 30, PF 45, inv/ev intact Negative ant drawer, talar tilt, rotation test, squeeze test.  Neg thompson No pain with resisted inversion or eversion    Electronically signed by:  [name] [facility] 3:02 PM 07/05/23

## 2023-07-04 NOTE — Progress Notes (Signed)
 His CT scan is normal.  He should let us  know if his pain is not improving.

## 2023-07-05 ENCOUNTER — Ambulatory Visit: Payer: BC Managed Care – PPO | Admitting: Sports Medicine

## 2023-07-05 VITALS — BP 110/78 | HR 85 | Ht 69.0 in | Wt 168.0 lb

## 2023-07-05 DIAGNOSIS — S86811D Strain of other muscle(s) and tendon(s) at lower leg level, right leg, subsequent encounter: Secondary | ICD-10-CM | POA: Diagnosis not present

## 2023-07-05 DIAGNOSIS — G8929 Other chronic pain: Secondary | ICD-10-CM

## 2023-07-05 DIAGNOSIS — M25561 Pain in right knee: Secondary | ICD-10-CM | POA: Diagnosis not present

## 2023-07-05 DIAGNOSIS — M79671 Pain in right foot: Secondary | ICD-10-CM | POA: Diagnosis not present

## 2023-07-05 NOTE — Patient Instructions (Signed)
Recommend reaching out to PT  Can use Voltaren gel over areas of pain  As needed follow up

## 2023-07-12 DIAGNOSIS — M6281 Muscle weakness (generalized): Secondary | ICD-10-CM | POA: Diagnosis not present

## 2023-07-12 DIAGNOSIS — M25561 Pain in right knee: Secondary | ICD-10-CM | POA: Diagnosis not present

## 2023-07-12 DIAGNOSIS — M79661 Pain in right lower leg: Secondary | ICD-10-CM | POA: Diagnosis not present

## 2023-07-12 DIAGNOSIS — M25571 Pain in right ankle and joints of right foot: Secondary | ICD-10-CM | POA: Diagnosis not present

## 2023-07-20 DIAGNOSIS — M25571 Pain in right ankle and joints of right foot: Secondary | ICD-10-CM | POA: Diagnosis not present

## 2023-07-20 DIAGNOSIS — M25561 Pain in right knee: Secondary | ICD-10-CM | POA: Diagnosis not present

## 2023-07-20 DIAGNOSIS — M6281 Muscle weakness (generalized): Secondary | ICD-10-CM | POA: Diagnosis not present

## 2023-07-20 DIAGNOSIS — M79661 Pain in right lower leg: Secondary | ICD-10-CM | POA: Diagnosis not present

## 2023-07-21 ENCOUNTER — Ambulatory Visit: Payer: BC Managed Care – PPO | Admitting: Family Medicine

## 2023-07-21 ENCOUNTER — Encounter: Payer: Self-pay | Admitting: Family Medicine

## 2023-07-21 VITALS — BP 126/85 | HR 62 | Temp 98.1°F | Ht 69.0 in | Wt 168.4 lb

## 2023-07-21 DIAGNOSIS — R221 Localized swelling, mass and lump, neck: Secondary | ICD-10-CM

## 2023-07-21 DIAGNOSIS — R1031 Right lower quadrant pain: Secondary | ICD-10-CM | POA: Diagnosis not present

## 2023-07-21 NOTE — Patient Instructions (Addendum)
 It was very nice to see you today!  I think the lump in your neck is from the inflamed hair follicle.  This should improve over the next few weeks.  Let us know if you have any change in symptoms or if your symptoms do not continue to improve.  Return for Annual Physical.   Take care, Dr [name]  PLEASE NOTE:  If you had any lab tests, please let us know if you have not heard back within a few days. You may see your results on mychart before we have a chance to review them but we will give you a call once they are reviewed by [place].   If we ordered any referrals today, please let us know if you have not heard from their office within the next week.   If you had any urgent prescriptions sent in today, please check with the pharmacy within an hour of our visit to make sure the prescription was transmitted appropriately.   Please try these tips to maintain a healthy lifestyle:  Eat at least 3 REAL meals and 1-2 snacks per day.  Aim for no more than 5 hours between eating.  If you eat breakfast, please do so within one hour of getting up.   Each meal should contain half fruits/vegetables, one quarter protein, and one quarter carbs (no bigger than a computer mouse)  Cut down on sweet beverages. This includes juice, soda, and sweet tea.   Drink at least 1 glass of water with each meal and aim for at least 8 glasses per day  Exercise at least 150 minutes every week.

## 2023-07-21 NOTE — Assessment & Plan Note (Addendum)
 Symptoms have been stable since our last visit.  He did see urology and they did not recommend any further workup.  Recent CT scan was negative.  Likely musculoskeletal etiology.  He will continue working with physical therapy.  He can discuss with sports medicine at their next visit as well.

## 2023-07-21 NOTE — Progress Notes (Signed)
   Jeffery Le is a 38 y.o. male who presents today for an office visit.  Assessment/Plan:  New/Acute Problems: Neck Nodule No red flags.  Likely due to residual indurated area from his recent ingrown hair/folliculitis.  Exam is not consistent with lymphadenopathy.  Discussed natural course of illness.  Suspect that this will continue to improve over the next few weeks.  If not improving or if symptoms worsen we can check ultrasound.  We discussed reasons to return to care.  Chronic Problems Addressed Today: Right inguinal pain Symptoms have been stable since our last visit.  He did see urology and they did not recommend any further workup.  Recent CT scan was negative.  Likely musculoskeletal etiology.  He will continue working with physical therapy.  He can discuss with sports medicine at their next visit as well.     Subjective:  HPI:  Patient here with lesion on neck for the last few weeks.  Located on the left neck.  This started as a pimple or an ingrown hair several weeks ago.  Initially painful.  This is resolved however since then he has a small nodule to the area.  No longer painful.  It has not changed recently.  No drainage.  No bleeding.  No fevers or chills.  No treatments tried.      Objective:  Physical Exam: BP 126/85   Pulse 62   Temp 98.1 F (36.7 C) (Temporal)   Ht 5\' 9"  (1.753 m)   Wt 168 lb 6.4 oz (76.4 kg)   SpO2 98%   BMI 24.87 kg/m   Gen: No acute distress, resting comfortably HEENT: Small approximately 3 to 4 mm freely mobile nodule on left anterior neck just under surface of skin.  No lymphadenopathy appreciated. CV: Regular rate and rhythm with no murmurs appreciated Pulm: Normal work of breathing, clear to auscultation bilaterally with no crackles, wheezes, or rhonchi Neuro: Grossly normal, moves all extremities Psych: Normal affect and thought content      Jeffery Le M. [name], MD 07/21/2023 2:51 PM

## 2023-07-22 DIAGNOSIS — M6281 Muscle weakness (generalized): Secondary | ICD-10-CM | POA: Diagnosis not present

## 2023-07-22 DIAGNOSIS — M25561 Pain in right knee: Secondary | ICD-10-CM | POA: Diagnosis not present

## 2023-07-22 DIAGNOSIS — M25571 Pain in right ankle and joints of right foot: Secondary | ICD-10-CM | POA: Diagnosis not present

## 2023-07-22 DIAGNOSIS — M79661 Pain in right lower leg: Secondary | ICD-10-CM | POA: Diagnosis not present

## 2023-07-26 DIAGNOSIS — M25571 Pain in right ankle and joints of right foot: Secondary | ICD-10-CM | POA: Diagnosis not present

## 2023-07-26 DIAGNOSIS — M25561 Pain in right knee: Secondary | ICD-10-CM | POA: Diagnosis not present

## 2023-07-26 DIAGNOSIS — M6281 Muscle weakness (generalized): Secondary | ICD-10-CM | POA: Diagnosis not present

## 2023-07-26 DIAGNOSIS — M79661 Pain in right lower leg: Secondary | ICD-10-CM | POA: Diagnosis not present

## 2023-07-28 DIAGNOSIS — M6281 Muscle weakness (generalized): Secondary | ICD-10-CM | POA: Diagnosis not present

## 2023-07-28 DIAGNOSIS — M79661 Pain in right lower leg: Secondary | ICD-10-CM | POA: Diagnosis not present

## 2023-07-28 DIAGNOSIS — M25561 Pain in right knee: Secondary | ICD-10-CM | POA: Diagnosis not present

## 2023-07-28 DIAGNOSIS — M25571 Pain in right ankle and joints of right foot: Secondary | ICD-10-CM | POA: Diagnosis not present

## 2023-08-08 DIAGNOSIS — M79661 Pain in right lower leg: Secondary | ICD-10-CM | POA: Diagnosis not present

## 2023-08-08 DIAGNOSIS — M25571 Pain in right ankle and joints of right foot: Secondary | ICD-10-CM | POA: Diagnosis not present

## 2023-08-08 DIAGNOSIS — M25561 Pain in right knee: Secondary | ICD-10-CM | POA: Diagnosis not present

## 2023-08-08 DIAGNOSIS — M6281 Muscle weakness (generalized): Secondary | ICD-10-CM | POA: Diagnosis not present

## 2023-08-10 DIAGNOSIS — M25571 Pain in right ankle and joints of right foot: Secondary | ICD-10-CM | POA: Diagnosis not present

## 2023-08-10 DIAGNOSIS — M6281 Muscle weakness (generalized): Secondary | ICD-10-CM | POA: Diagnosis not present

## 2023-08-10 DIAGNOSIS — M25561 Pain in right knee: Secondary | ICD-10-CM | POA: Diagnosis not present

## 2023-08-10 DIAGNOSIS — M79661 Pain in right lower leg: Secondary | ICD-10-CM | POA: Diagnosis not present

## 2023-08-22 DIAGNOSIS — D225 Melanocytic nevi of trunk: Secondary | ICD-10-CM | POA: Diagnosis not present

## 2023-08-22 DIAGNOSIS — D2272 Melanocytic nevi of left lower limb, including hip: Secondary | ICD-10-CM | POA: Diagnosis not present

## 2023-08-22 DIAGNOSIS — M25571 Pain in right ankle and joints of right foot: Secondary | ICD-10-CM | POA: Diagnosis not present

## 2023-08-22 DIAGNOSIS — M6281 Muscle weakness (generalized): Secondary | ICD-10-CM | POA: Diagnosis not present

## 2023-08-22 DIAGNOSIS — M25561 Pain in right knee: Secondary | ICD-10-CM | POA: Diagnosis not present

## 2023-08-22 DIAGNOSIS — M79661 Pain in right lower leg: Secondary | ICD-10-CM | POA: Diagnosis not present

## 2023-08-22 DIAGNOSIS — Z129 Encounter for screening for malignant neoplasm, site unspecified: Secondary | ICD-10-CM | POA: Diagnosis not present

## 2023-08-24 DIAGNOSIS — M25561 Pain in right knee: Secondary | ICD-10-CM | POA: Diagnosis not present

## 2023-08-24 DIAGNOSIS — M25571 Pain in right ankle and joints of right foot: Secondary | ICD-10-CM | POA: Diagnosis not present

## 2023-08-24 DIAGNOSIS — M79661 Pain in right lower leg: Secondary | ICD-10-CM | POA: Diagnosis not present

## 2023-08-24 DIAGNOSIS — M6281 Muscle weakness (generalized): Secondary | ICD-10-CM | POA: Diagnosis not present

## 2023-08-29 DIAGNOSIS — M79661 Pain in right lower leg: Secondary | ICD-10-CM | POA: Diagnosis not present

## 2023-08-29 DIAGNOSIS — M6281 Muscle weakness (generalized): Secondary | ICD-10-CM | POA: Diagnosis not present

## 2023-08-29 DIAGNOSIS — M25571 Pain in right ankle and joints of right foot: Secondary | ICD-10-CM | POA: Diagnosis not present

## 2023-08-29 DIAGNOSIS — M25561 Pain in right knee: Secondary | ICD-10-CM | POA: Diagnosis not present

## 2023-09-02 DIAGNOSIS — T1512XA Foreign body in conjunctival sac, left eye, initial encounter: Secondary | ICD-10-CM | POA: Diagnosis not present

## 2023-09-05 DIAGNOSIS — M25561 Pain in right knee: Secondary | ICD-10-CM | POA: Diagnosis not present

## 2023-09-05 DIAGNOSIS — M6281 Muscle weakness (generalized): Secondary | ICD-10-CM | POA: Diagnosis not present

## 2023-09-05 DIAGNOSIS — M25571 Pain in right ankle and joints of right foot: Secondary | ICD-10-CM | POA: Diagnosis not present

## 2023-09-05 DIAGNOSIS — M79661 Pain in right lower leg: Secondary | ICD-10-CM | POA: Diagnosis not present

## 2023-09-07 DIAGNOSIS — M25561 Pain in right knee: Secondary | ICD-10-CM | POA: Diagnosis not present

## 2023-09-07 DIAGNOSIS — M6281 Muscle weakness (generalized): Secondary | ICD-10-CM | POA: Diagnosis not present

## 2023-09-07 DIAGNOSIS — M79661 Pain in right lower leg: Secondary | ICD-10-CM | POA: Diagnosis not present

## 2023-09-07 DIAGNOSIS — M25571 Pain in right ankle and joints of right foot: Secondary | ICD-10-CM | POA: Diagnosis not present

## 2023-09-27 DIAGNOSIS — M25561 Pain in right knee: Secondary | ICD-10-CM | POA: Diagnosis not present

## 2023-09-27 DIAGNOSIS — M79661 Pain in right lower leg: Secondary | ICD-10-CM | POA: Diagnosis not present

## 2023-09-27 DIAGNOSIS — M25571 Pain in right ankle and joints of right foot: Secondary | ICD-10-CM | POA: Diagnosis not present

## 2023-09-27 DIAGNOSIS — M6281 Muscle weakness (generalized): Secondary | ICD-10-CM | POA: Diagnosis not present

## 2023-09-28 DIAGNOSIS — J069 Acute upper respiratory infection, unspecified: Secondary | ICD-10-CM | POA: Diagnosis not present

## 2023-10-03 DIAGNOSIS — M6281 Muscle weakness (generalized): Secondary | ICD-10-CM | POA: Diagnosis not present

## 2023-10-03 DIAGNOSIS — M25561 Pain in right knee: Secondary | ICD-10-CM | POA: Diagnosis not present

## 2023-10-03 DIAGNOSIS — M79661 Pain in right lower leg: Secondary | ICD-10-CM | POA: Diagnosis not present

## 2023-10-03 DIAGNOSIS — M25571 Pain in right ankle and joints of right foot: Secondary | ICD-10-CM | POA: Diagnosis not present

## 2023-10-12 DIAGNOSIS — G8929 Other chronic pain: Secondary | ICD-10-CM | POA: Diagnosis not present

## 2023-10-12 DIAGNOSIS — M25561 Pain in right knee: Secondary | ICD-10-CM | POA: Diagnosis not present

## 2023-10-12 DIAGNOSIS — M25571 Pain in right ankle and joints of right foot: Secondary | ICD-10-CM | POA: Diagnosis not present

## 2023-10-12 DIAGNOSIS — M6281 Muscle weakness (generalized): Secondary | ICD-10-CM | POA: Diagnosis not present

## 2023-10-12 DIAGNOSIS — R1030 Lower abdominal pain, unspecified: Secondary | ICD-10-CM | POA: Diagnosis not present

## 2023-10-12 DIAGNOSIS — M79661 Pain in right lower leg: Secondary | ICD-10-CM | POA: Diagnosis not present

## 2023-10-12 DIAGNOSIS — Z1330 Encounter for screening examination for mental health and behavioral disorders, unspecified: Secondary | ICD-10-CM | POA: Diagnosis not present

## 2023-10-12 DIAGNOSIS — Z7689 Persons encountering health services in other specified circumstances: Secondary | ICD-10-CM | POA: Diagnosis not present

## 2023-10-18 DIAGNOSIS — M546 Pain in thoracic spine: Secondary | ICD-10-CM | POA: Diagnosis not present

## 2023-10-18 DIAGNOSIS — Z13220 Encounter for screening for lipoid disorders: Secondary | ICD-10-CM | POA: Diagnosis not present

## 2023-10-31 DIAGNOSIS — K4090 Unilateral inguinal hernia, without obstruction or gangrene, not specified as recurrent: Secondary | ICD-10-CM | POA: Diagnosis not present

## 2023-10-31 DIAGNOSIS — G8929 Other chronic pain: Secondary | ICD-10-CM | POA: Diagnosis not present

## 2023-10-31 DIAGNOSIS — Z0000 Encounter for general adult medical examination without abnormal findings: Secondary | ICD-10-CM | POA: Diagnosis not present

## 2023-10-31 DIAGNOSIS — M545 Low back pain, unspecified: Secondary | ICD-10-CM | POA: Diagnosis not present

## 2023-11-08 ENCOUNTER — Encounter: Payer: BC Managed Care – PPO | Admitting: Family Medicine

## 2023-11-10 DIAGNOSIS — N451 Epididymitis: Secondary | ICD-10-CM | POA: Diagnosis not present

## 2023-11-10 DIAGNOSIS — N50812 Left testicular pain: Secondary | ICD-10-CM | POA: Diagnosis not present

## 2023-11-23 DIAGNOSIS — N50812 Left testicular pain: Secondary | ICD-10-CM | POA: Diagnosis not present

## 2023-11-23 DIAGNOSIS — Z87438 Personal history of other diseases of male genital organs: Secondary | ICD-10-CM | POA: Diagnosis not present

## 2023-12-05 NOTE — Progress Notes (Unsigned)
    Ben Jackson D[facility] [address] [place] [address] Phone: [phone]   Assessment and Plan:     There are no diagnoses linked to this encounter.  ***   Pertinent previous records reviewed include ***    Follow Up: ***     Subjective:   I, Jeffery Le, am serving as a Neurosurgeon for Doctor Morene Mace   Chief Complaint: right shin pain    HPI:    05/11/23 Patient is a 38 year old male with right shin pain. Patient states he has been training for a marathon. He has upped his mileage and weightlifting for about a year. Mile 14 is where he started feeling the pain this has been going on for about a month or so. Marathon is 06/02/2023. Tylenol for the pain and that helped a little. States there was swelling but that resolved. Has been icing and heating. No numbness or tingling. He has a constant throb of pain. Wants to come up with a plan    06/13/2023 Patient states that he is feeling was able to run marathon. He has a list of questions   07/05/2023 Patient states that he thinks he has a pinched nerve on the top of his foot . He has a sharp pain when the blankets catch his toes.   12/06/2023 Patient states   Relevant Historical Information: None pertinent    Additional pertinent review of systems negative.   Current Outpatient Medications:    ketoconazole  (NIZORAL ) 2 % cream, Apply 1 Application topically 2 (two) times daily., Disp: 60 g, Rfl: 0   meloxicam  (MOBIC ) 15 MG tablet, Take 1 tablet (15 mg total) by mouth daily., Disp: 30 tablet, Rfl: 0   Multiple Vitamin (MULTIVITAMIN WITH MINERALS) TABS, Take 1 tablet by mouth daily., Disp: , Rfl:    naproxen sodium (ALEVE) 220 MG tablet, Take 220 mg by mouth., Disp: , Rfl:    Objective:     There were no vitals filed for this visit.    There is no height or weight on file to calculate BMI.    Physical Exam:    ***   Electronically signed by:  Odis Mace [facility] 12:38 PM 12/05/23

## 2023-12-06 ENCOUNTER — Ambulatory Visit: Admitting: Sports Medicine

## 2023-12-06 VITALS — HR 77 | Ht 69.0 in | Wt 168.0 lb

## 2023-12-06 DIAGNOSIS — S86811D Strain of other muscle(s) and tendon(s) at lower leg level, right leg, subsequent encounter: Secondary | ICD-10-CM | POA: Diagnosis not present

## 2023-12-06 DIAGNOSIS — M79604 Pain in right leg: Secondary | ICD-10-CM

## 2023-12-06 DIAGNOSIS — M25561 Pain in right knee: Secondary | ICD-10-CM

## 2023-12-06 DIAGNOSIS — G8929 Other chronic pain: Secondary | ICD-10-CM

## 2023-12-06 DIAGNOSIS — M79671 Pain in right foot: Secondary | ICD-10-CM

## 2023-12-06 DIAGNOSIS — Y9379 Activity, other specified sports and athletics: Secondary | ICD-10-CM

## 2023-12-06 DIAGNOSIS — S3991XA Unspecified injury of abdomen, initial encounter: Secondary | ICD-10-CM

## 2023-12-06 NOTE — Patient Instructions (Addendum)
 MRI pelvis knee and tib fib  - Start meloxicam  15 mg daily x2 weeks.  If still having pain after 2 weeks, complete 3rd-week of NSAID. May use remaining NSAID as needed once daily for pain control.  Do not to use additional over-the-counter NSAIDs (ibuprofen, naproxen, Advil, Aleve, etc.) while taking prescription NSAIDs.  May use Tylenol [phone] mg 2 to 3 times a day for breakthrough pain. HEP  Follow up 5 days after MRI to discuss results

## 2023-12-07 ENCOUNTER — Encounter: Payer: Self-pay | Admitting: Sports Medicine

## 2023-12-13 ENCOUNTER — Inpatient Hospital Stay
Admission: RE | Admit: 2023-12-13 | Discharge: 2023-12-13 | Discharge status: Home / Self Care | Source: Ambulatory Visit | Attending: Sports Medicine

## 2023-12-13 ENCOUNTER — Other Ambulatory Visit

## 2023-12-13 ENCOUNTER — Ambulatory Visit
Admission: RE | Admit: 2023-12-13 | Discharge: 2023-12-13 | Disposition: A | Discharge status: Home / Self Care | Source: Ambulatory Visit | Attending: Sports Medicine | Admitting: Sports Medicine

## 2023-12-13 DIAGNOSIS — S3991XA Unspecified injury of abdomen, initial encounter: Secondary | ICD-10-CM

## 2023-12-13 DIAGNOSIS — S86811D Strain of other muscle(s) and tendon(s) at lower leg level, right leg, subsequent encounter: Secondary | ICD-10-CM

## 2023-12-13 DIAGNOSIS — M79671 Pain in right foot: Secondary | ICD-10-CM

## 2023-12-13 DIAGNOSIS — M79604 Pain in right leg: Secondary | ICD-10-CM

## 2023-12-13 DIAGNOSIS — G8929 Other chronic pain: Secondary | ICD-10-CM

## 2023-12-13 DIAGNOSIS — M79661 Pain in right lower leg: Secondary | ICD-10-CM | POA: Diagnosis not present

## 2023-12-14 ENCOUNTER — Ambulatory Visit: Payer: Self-pay | Admitting: Sports Medicine

## 2023-12-20 NOTE — Progress Notes (Deleted)
    [facility] [address] [place] [address] Phone: [phone]   Assessment and Plan:     There are no diagnoses linked to this encounter.  ***   Pertinent previous records reviewed include ***    Follow Up: ***     Subjective:   I, Jeffery Le, am serving as a Neurosurgeon for Doctor Morene Mace   Chief Complaint: right shin pain    HPI:    05/11/23 Patient is a 38 year old male with right shin pain. Patient states he has been training for a marathon. He has upped his mileage and weightlifting for about a year. Mile 14 is where he started feeling the pain this has been going on for about a month or so. Marathon is 06/02/2023. Tylenol for the pain and that helped a little. States there was swelling but that resolved. Has been icing and heating. No numbness or tingling. He has a constant throb of pain. Wants to come up with a plan    06/13/2023 Patient states that he is feeling was able to run marathon. He has a list of questions   07/05/2023 Patient states that he thinks he has a pinched nerve on the top of his foot . He has a sharp pain when the blankets catch his toes.    12/06/2023 Patient states flare started 2 weeks ago. More spots on the lower leg now. L side groin issue as well   12/21/2023 Patient states  Relevant Historical Information: None pertinent Additional pertinent review of systems negative.   Current Outpatient Medications:    ketoconazole  (NIZORAL ) 2 % cream, Apply 1 Application topically 2 (two) times daily., Disp: 60 g, Rfl: 0   meloxicam  (MOBIC ) 15 MG tablet, Take 1 tablet (15 mg total) by mouth daily., Disp: 30 tablet, Rfl: 0   Multiple Vitamin (MULTIVITAMIN WITH MINERALS) TABS, Take 1 tablet by mouth daily., Disp: , Rfl:    naproxen sodium (ALEVE) 220 MG tablet, Take 220 mg by mouth., Disp: , Rfl:    Objective:     There were no vitals filed for this visit.    There is no height or  weight on file to calculate BMI.    Physical Exam:    ***   Electronically signed by:  Odis Mace [facility] 7:46 AM 12/20/23

## 2023-12-21 ENCOUNTER — Ambulatory Visit: Admitting: Sports Medicine

## 2023-12-26 ENCOUNTER — Ambulatory Visit: Admitting: Sports Medicine

## 2023-12-27 ENCOUNTER — Ambulatory Visit: Admitting: Sports Medicine

## 2023-12-27 VITALS — HR 77 | Ht 69.0 in | Wt 163.0 lb

## 2023-12-27 DIAGNOSIS — S86811D Strain of other muscle(s) and tendon(s) at lower leg level, right leg, subsequent encounter: Secondary | ICD-10-CM

## 2023-12-27 DIAGNOSIS — G8929 Other chronic pain: Secondary | ICD-10-CM

## 2023-12-27 DIAGNOSIS — S76812A Strain of other specified muscles, fascia and tendons at thigh level, left thigh, initial encounter: Secondary | ICD-10-CM

## 2023-12-27 DIAGNOSIS — M79671 Pain in right foot: Secondary | ICD-10-CM | POA: Diagnosis not present

## 2023-12-27 DIAGNOSIS — M25561 Pain in right knee: Secondary | ICD-10-CM

## 2023-12-27 DIAGNOSIS — M79604 Pain in right leg: Secondary | ICD-10-CM

## 2023-12-27 NOTE — Patient Instructions (Signed)
 PT referral  Continue physical activity as tolerated  8 week follow up

## 2023-12-27 NOTE — Progress Notes (Signed)
 Jeffery Le D[facility] [address] [place] [address] Phone: [phone]   Assessment and Plan:     1. Strain of left iliopsoas muscle, initial encounter 2. Chronic pain of right knee 3. Strain of right tibialis anterior muscle, subsequent encounter 4. Right foot pain 5. Right leg pain -Chronic with exacerbation, subsequent visit - Reviewed MRIs of right knee, right tib-fib, pelvis with patient at today's visit.  Right knee and right tib-fib MRIs were completely unremarkable.  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  Recommend avoiding activity that reproduces sharp stabbing pain.  Recommend starting with low impact exercise such as stationary bike, elliptical, swimming and gradually progress to running activities - Patient has had some relief with NSAID courses, HEP, activity modification, physical therapy in the past, however symptoms continue to flare when returning to activity   Pertinent previous records reviewed include MRIs right tib-fib, right knee, pelvis 12/13/2023  Follow Up: 8 weeks for reevaluation.  Could consider CSI versus PRP injection to left iliopsoas tendon if no improvement   Subjective:   I, Jeffery Le, am serving as a Neurosurgeon for Doctor Jeffery Le   Chief Complaint: right shin pain    HPI:    05/11/23 Patient is a 38 year old male with right shin pain. Patient states he has been training for a marathon. He has upped his mileage and weightlifting for about a year. Mile 14 is where he started feeling the pain this has been going on for about a month or so. Marathon is 06/02/2023. Tylenol for the pain and that helped a little.  States there was swelling but that resolved. Has been icing and heating. No numbness or tingling. He has a constant throb of pain. Wants to come up with a plan    06/13/2023 Patient states that he is feeling was able to run marathon. He has a list of questions   07/05/2023 Patient states that he thinks he has a pinched nerve on the top of his foot . He has a sharp pain when the blankets catch his toes.    12/06/2023 Patient states flare started 2 weeks ago. More spots on the lower leg now. L side groin issue as well   12/27/2023 Patient states  would like a referral for all the things  Relevant Historical Information: None pertinent  Additional pertinent review of systems negative.   Current Outpatient Medications:    ketoconazole  (NIZORAL ) 2 % cream, Apply 1 Application topically 2 (two) times daily., Disp: 60 g, Rfl: 0   meloxicam  (MOBIC ) 15 MG tablet, Take 1 tablet (15 mg total) by mouth daily., Disp: 30 tablet, Rfl: 0   Multiple Vitamin (MULTIVITAMIN WITH MINERALS) TABS, Take 1 tablet by mouth daily., Disp: , Rfl:    naproxen sodium (ALEVE) 220 MG tablet, Take 220 mg by mouth., Disp: , Rfl:    Objective:     Vitals:   12/27/23 1307  Pulse: 77  SpO2: 99%  Weight: 163 lb (73.9 kg)  Height: 5' 9 (1.753 m)      Body mass index is 24.07 kg/m.    Physical Exam:    Gen: Appears well, nad, nontoxic and pleasant Psych: Alert and oriented,  appropriate mood and affect Neuro: sensation intact, strength is 5/5 with df/pf/inv/ev, muscle tone wnl Skin: no susupicious lesions or rashes   Right shin/ankle:  No deformity, no swelling or effusion TTP mildly along anterior tibialis musculature between tibia and fibula, trace along medial tibia NTTP over fibular head, lat mal, medial mal, achilles, navicular, base of 5th, ATFL, CFL, deltoid, calcaneous or midfoot ROM DF 30, PF 45, inv/ev intact Negative ant drawer, talar tilt, rotation test, squeeze test. Neg thompson No pain with  resisted inversion or eversion  Trace anterior shin discomfort with resisted dorsiflexion   Pelvis: TTP left-sided hip flexors.  Pain over left side of groin with resisted left leg adduction, and resisted core flexion    Electronically signed by:  Odis Le [facility] 1:53 PM 12/27/23

## 2024-01-03 ENCOUNTER — Ambulatory Visit: Admitting: Sports Medicine

## 2024-01-17 DIAGNOSIS — S29011A Strain of muscle and tendon of front wall of thorax, initial encounter: Secondary | ICD-10-CM | POA: Diagnosis not present

## 2024-01-17 DIAGNOSIS — T148XXA Other injury of unspecified body region, initial encounter: Secondary | ICD-10-CM | POA: Diagnosis not present

## 2024-01-22 DIAGNOSIS — X58XXXA Exposure to other specified factors, initial encounter: Secondary | ICD-10-CM | POA: Diagnosis not present

## 2024-01-22 DIAGNOSIS — M940 Chondrocostal junction syndrome [Tietze]: Secondary | ICD-10-CM | POA: Diagnosis not present

## 2024-01-22 DIAGNOSIS — S39002A Unspecified injury of muscle, fascia and tendon of lower back, initial encounter: Secondary | ICD-10-CM | POA: Diagnosis not present

## 2024-01-22 DIAGNOSIS — F17220 Nicotine dependence, chewing tobacco, uncomplicated: Secondary | ICD-10-CM | POA: Diagnosis not present

## 2024-01-22 DIAGNOSIS — S29011A Strain of muscle and tendon of front wall of thorax, initial encounter: Secondary | ICD-10-CM | POA: Diagnosis not present

## 2024-01-22 DIAGNOSIS — S29011S Strain of muscle and tendon of front wall of thorax, sequela: Secondary | ICD-10-CM | POA: Diagnosis not present

## 2024-01-22 DIAGNOSIS — R21 Rash and other nonspecific skin eruption: Secondary | ICD-10-CM | POA: Diagnosis not present

## 2024-01-22 DIAGNOSIS — R079 Chest pain, unspecified: Secondary | ICD-10-CM | POA: Diagnosis not present

## 2024-01-22 DIAGNOSIS — L308 Other specified dermatitis: Secondary | ICD-10-CM | POA: Diagnosis not present

## 2024-01-22 DIAGNOSIS — R748 Abnormal levels of other serum enzymes: Secondary | ICD-10-CM | POA: Diagnosis not present

## 2024-01-22 DIAGNOSIS — M545 Low back pain, unspecified: Secondary | ICD-10-CM | POA: Diagnosis not present

## 2024-01-22 DIAGNOSIS — M9908 Segmental and somatic dysfunction of rib cage: Secondary | ICD-10-CM | POA: Diagnosis not present

## 2024-01-22 DIAGNOSIS — R0789 Other chest pain: Secondary | ICD-10-CM | POA: Diagnosis not present

## 2024-01-31 DIAGNOSIS — S29011D Strain of muscle and tendon of front wall of thorax, subsequent encounter: Secondary | ICD-10-CM | POA: Diagnosis not present

## 2024-01-31 DIAGNOSIS — S29012D Strain of muscle and tendon of back wall of thorax, subsequent encounter: Secondary | ICD-10-CM | POA: Diagnosis not present

## 2024-02-06 NOTE — Progress Notes (Deleted)
    [facility] [address] [place] [address] Phone: [phone]   Assessment and Plan:     ***    Pertinent previous records reviewed include ***   Follow Up: ***     Subjective:   I, Jeffery Le, am serving as a Neurosurgeon for Doctor Morene Mace   Chief Complaint: right shin pain    HPI:    05/11/23 Patient is a 38 year old male with right shin pain. Patient states he has been training for a marathon. He has upped his mileage and weightlifting for about a year. Mile 14 is where he started feeling the pain this has been going on for about a month or so. Marathon is 06/02/2023. Tylenol for the pain and that helped a little. States there was swelling but that resolved. Has been icing and heating. No numbness or tingling. He has a constant throb of pain. Wants to come up with a plan    06/13/2023 Patient states that he is feeling was able to run marathon. He has a list of questions   07/05/2023 Patient states that he thinks he has a pinched nerve on the top of his foot . He has a sharp pain when the blankets catch his toes.    12/06/2023 Patient states flare started 2 weeks ago. More spots on the lower leg now. L side groin issue as well    12/27/2023 Patient states  would like a referral for all the things   02/07/2024 Patient states  Relevant Historical Information: None pertinent  Additional pertinent review of systems negative.   Current Outpatient Medications:    ketoconazole  (NIZORAL ) 2 % cream, Apply 1 Application topically 2 (two) times daily., Disp: 60 g, Rfl: 0   meloxicam  (MOBIC ) 15 MG tablet, Take 1 tablet (15 mg total) by mouth daily., Disp: 30 tablet, Rfl: 0   Multiple Vitamin (MULTIVITAMIN WITH MINERALS) TABS, Take 1 tablet by mouth daily., Disp: , Rfl:    naproxen sodium (ALEVE) 220 MG tablet, Take 220 mg by mouth., Disp: , Rfl:    Objective:     There were no vitals filed for this visit.     There is no height or weight on file to calculate BMI.    Physical Exam:    ***   Electronically signed by:  Odis Mace [facility] 7:44 AM 02/06/24

## 2024-02-07 ENCOUNTER — Ambulatory Visit: Admitting: Sports Medicine

## 2024-02-16 NOTE — Progress Notes (Unsigned)
 [facility] [address] [place] [address] Phone: [phone]   Assessment and Plan:     1. 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  Physical therapy, relative rest, gradually increasing physical activity as tolerated have been helpful. - Patient presented today for PRP consultation.  We discussed that after PRP injection, I recommend that patient avoid aggravating activities for a minimum of 4 to 6 weeks to allow for proper healing of tendon.  Patient would be able to continue home exercises and physical therapy, but I would not recommend training for half marathon planned for October as this would likely aggravate iliopsoas tendon and prevent the desired needling.  Patient elected to not proceed with PRP injection today so that he can continue training for half marathon in October.  Patient has full marathon in January 2026, so could consider PRP after completing this race. - Continue physical therapy - Continue HEP    Pertinent previous records reviewed include none   Follow Up: As needed.  Could consider PRP injection   Subjective:   I, Jeffery Le, am serving as a Neurosurgeon for Doctor Morene Mace   Chief Complaint: right shin pain    HPI:    05/11/23 Patient is a 38 year old male with right shin pain. Patient states he has been training for a marathon. He has upped his mileage and weightlifting for about a year. Mile 14 is where he started feeling the pain this has been going on for about a month or so. Marathon is 06/02/2023. Tylenol for the pain and that helped a little. States there was swelling but that resolved. Has been icing and heating. No numbness or tingling. He has a constant  throb of pain. Wants to come up with a plan    06/13/2023 Patient states that he is feeling was able to run marathon. He has a list of questions   07/05/2023 Patient states that he thinks he has a pinched nerve on the top of his foot . He has a sharp pain when the blankets catch his toes.    12/06/2023 Patient states flare started 2 weeks ago. More spots on the lower leg now. L side groin issue as well    12/27/2023 Patient states  would like a referral for all the things   02/17/2024 Patient states he is feeling better. End of October 1/2 and then full January   Relevant Historical Information: None pertinent  Additional pertinent review of systems negative.   Current Outpatient Medications:    ketoconazole  (NIZORAL ) 2 % cream, Apply 1 Application topically 2 (two) times daily., Disp: 60 g, Rfl: 0   meloxicam  (MOBIC ) 15 MG tablet, Take 1 tablet (15 mg total) by mouth daily., Disp: 30 tablet, Rfl: 0   Multiple Vitamin (MULTIVITAMIN WITH MINERALS) TABS, Take 1 tablet by mouth daily., Disp: , Rfl:    naproxen sodium (ALEVE) 220 MG tablet, Take 220 mg by mouth., Disp: , Rfl:    Objective:     Vitals:   02/17/24 0840  BP: 120/80  Pulse: 87  SpO2: 98%  Weight: 166 lb (75.3 kg)  Height: 5' 9 (1.753 m)      Body mass index is 24.51 kg/m.    Physical Exam:    Gen: Appears  well, nad, nontoxic and pleasant Psych: Alert and oriented, appropriate mood and affect Neuro: sensation intact, strength is 5/5 with df/pf/inv/ev, muscle tone wnl Skin: no susupicious lesions or rashes   Pelvis: TTP left-sided hip flexors.  Pain over left side of groin with resisted left leg adduction, and resisted core flexion    Electronically signed by:  Odis Mace [facility] 9:02 AM 02/17/24

## 2024-02-17 ENCOUNTER — Ambulatory Visit: Admitting: Sports Medicine

## 2024-02-17 VITALS — BP 120/80 | HR 87 | Ht 69.0 in | Wt 166.0 lb

## 2024-02-17 DIAGNOSIS — S76812D Strain of other specified muscles, fascia and tendons at thigh level, left thigh, subsequent encounter: Secondary | ICD-10-CM | POA: Diagnosis not present

## 2024-02-22 ENCOUNTER — Ambulatory Visit: Admitting: Sports Medicine

## 2024-02-22 DIAGNOSIS — H25042 Posterior subcapsular polar age-related cataract, left eye: Secondary | ICD-10-CM | POA: Diagnosis not present

## 2024-04-02 DIAGNOSIS — M79671 Pain in right foot: Secondary | ICD-10-CM | POA: Diagnosis not present

## 2024-04-10 DIAGNOSIS — J069 Acute upper respiratory infection, unspecified: Secondary | ICD-10-CM | POA: Diagnosis not present

## 2024-04-10 DIAGNOSIS — H6501 Acute serous otitis media, right ear: Secondary | ICD-10-CM | POA: Diagnosis not present

## 2024-04-14 DIAGNOSIS — J069 Acute upper respiratory infection, unspecified: Secondary | ICD-10-CM | POA: Diagnosis not present

## 2024-06-07 NOTE — Progress Notes (Unsigned)
 "               [facility] [address] [place] [address] Phone: [phone]   Assessment and Plan:     1. 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  Pain had generally been well-controlled, though mild flare after completing recent marathon - Patient could benefit from PRP injection to assist in long-term healing and decreasing chronic pain when returning to marathon training with goal of running marathon in January 2027.  Since we are planning on starting an NSAID course at this time, we will delay PRP - Continue HEP and physical therapy  2. Tendonitis, Achilles, left -Acute, initial visit - Consistent with left Achilles tendinitis flared from marathon training - Start HEP and continue physical therapy for Achilles tendinitis - Start meloxicam  15 mg daily x2 weeks.  If still having pain after 2 weeks, complete 3rd-week of NSAID. May use remaining NSAID as needed once daily for pain control.  Do not to use additional over-the-counter NSAIDs (ibuprofen, naproxen, Advil, Aleve, etc.) while taking prescription NSAIDs.  May use Tylenol [phone] mg 2 to 3 times a day for breakthrough pain.   15 additional minutes spent for educating Therapeutic Home Exercise Program.  This included exercises focusing on stretching, strengthening, with focus on eccentric aspects.   Long term goals include an improvement in range of motion, strength, endurance as well as avoiding reinjury. Patient's frequency would include in 1-2 times a day, 3-5 times a week for a duration of 6-12 weeks. Proper technique shown and discussed handout in great detail with ATC.  All questions were discussed and answered.      Pertinent previous records reviewed include none   Follow Up: 4 to 6 weeks for  reevaluation.  If Achilles tendinitis is still flared, could consider prednisone  course versus nitrogen patches versus ECSWT versus PRP injection.  Could consider PRP injection for left iliopsoas tendon   Subjective:   I, Jeffery Le, am serving as a neurosurgeon for Doctor Morene Mace   Chief Complaint: right shin pain    HPI:    05/11/23 Patient is a 39 year old male with right shin pain. Patient states he has been training for a marathon. He has upped his mileage and weightlifting for about a year. Mile 14 is where he started feeling the pain this has been going on for about a month or so. Marathon is 06/02/2023. Tylenol for the pain and that helped a little. States there was swelling but that resolved. Has been icing and heating. No numbness or tingling. He has a constant throb of pain. Wants to come up with a plan    06/13/2023 Patient states that he is feeling was able to run marathon. He has a list of questions   07/05/2023 Patient states that he thinks he has a pinched nerve on the top of his foot . He has a sharp pain when the blankets catch his toes.    12/06/2023 Patient states flare started 2 weeks ago. More spots on the lower leg now. L side groin issue as well    12/27/2023 Patient states  would like a referral for all the things    02/17/2024 Patient states he is feeling better. End of October 1/2 and then full January   06/08/2024 Patient states he  has some achilles tendonitis but has been working on it in PT . Wants to know what best course of action with the groin and achilles    Relevant Historical Information: None pertinent    Additional pertinent review of systems negative.  Current Medications[1]   Objective:     Vitals:   06/08/24 0955  Pulse: 72  SpO2: 95%  Weight: 169 lb (76.7 kg)  Height: 5' 9 (1.753 m)      Body mass index is 24.96 kg/m.    Physical Exam:    Gen: Appears well, nad, nontoxic and pleasant Psych: Alert and oriented, appropriate  mood and affect Neuro: sensation intact, strength is 5/5 with df/pf/inv/ev, muscle tone wnl Skin: no susupicious lesions or rashes  Left ankle:  No deformity, mild distal swelling and TTP  ROM DF 30, PF 45, inv/ev intact Gait normal  Electronically signed by:  [facility] 10:34 AM 06/08/24     [1]  Current Outpatient Medications:    meloxicam  (MOBIC ) 15 MG tablet, Take 1 tablet daily for 2 weeks.  If still in pain after 2 weeks, take 1 tablet daily for an additional 1 week., Disp: 30 tablet, Rfl: 0   ketoconazole  (NIZORAL ) 2 % cream, Apply 1 Application topically 2 (two) times daily., Disp: 60 g, Rfl: 0   meloxicam  (MOBIC ) 15 MG tablet, Take 1 tablet (15 mg total) by mouth daily., Disp: 30 tablet, Rfl: 0   Multiple Vitamin (MULTIVITAMIN WITH MINERALS) TABS, Take 1 tablet by mouth daily., Disp: , Rfl:    naproxen sodium (ALEVE) 220 MG tablet, Take 220 mg by mouth., Disp: , Rfl:   "

## 2024-06-08 ENCOUNTER — Encounter: Payer: Self-pay | Admitting: Sports Medicine

## 2024-06-08 ENCOUNTER — Ambulatory Visit: Admitting: Sports Medicine

## 2024-06-08 VITALS — HR 72 | Ht 69.0 in | Wt 169.0 lb

## 2024-06-08 DIAGNOSIS — M7662 Achilles tendinitis, left leg: Secondary | ICD-10-CM

## 2024-06-08 DIAGNOSIS — S76812D Strain of other specified muscles, fascia and tendons at thigh level, left thigh, subsequent encounter: Secondary | ICD-10-CM

## 2024-06-08 MED ORDER — MELOXICAM 15 MG PO TABS: ORAL_TABLET | ORAL | 0 refills | Status: AC

## 2024-06-08 NOTE — Patient Instructions (Signed)
-   Start meloxicam  15 mg daily x2 weeks.  If still having pain after 2 weeks, complete 3rd-week of NSAID. May use remaining NSAID as needed once daily for pain control.  Do not to use additional over-the-counter NSAIDs (ibuprofen, naproxen, Advil, Aleve, etc.) while taking prescription NSAIDs.  May use Tylenol [phone] mg 2 to 3 times a day for breakthrough pain.  Achilles HEP   Voltaren gel over areas of pain   4-6 week follow up

## 2024-06-12 NOTE — Progress Notes (Unsigned)
 "               [facility] [address] [place] [address] Phone: [phone]   Assessment and Plan:     1. 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  Tolerated well per note below.  Procedure: Ultrasound Guided left iliopsoas injection Side: Left  Indication: 1. Strain and longitudinal split tear of left iliopsoas tendon, subsequent encounter (Primary)   After explaining the procedure, viable alternatives, risks, and answering any questions, consent was given verbally.  Patient's blood was obtained and prepared by the staff using Tendon PRP centrifuge preporation. The site was cleaned with chlorhexidine prep. An ultrasound transducer was placed on the left anterior hip.  The glenohumeral joint, iliopsoas tendon, and femoral artery were identified in transverse plane. Then under ultrasound guidance and sterile technique 6.5 ml of PRP was injected adjacent to iliopsoas tendon.  This was well tolerated.  The needle was removed and dressing placed and post injection instructions were given including a discussion of likely return of pain today.  Pt was advised to not ice area or use NSAIDs for the next 6 weeks.   Pt was advised to call or return to clinic if these symptoms worsen or fail to improve as anticipated. Images permanently stored.     Pertinent previous records reviewed include none   Follow Up: 6 weeks for reevaluation.  Could consider further treatment of ongoing Achilles tendinitis with NSAID course   Subjective:   I, Eara Burruel, am serving as a neurosurgeon for Doctor Morene Mace   Chief Complaint: right shin pain    HPI:    05/11/23 Patient is a 39 year old male with right shin pain. Patient states he  has been training for a marathon. He has upped his mileage and weightlifting for about a year. Mile 14 is where he started feeling the pain this has been going on for about a month or so. Marathon is 06/02/2023. Tylenol for the pain and that helped a little. States there was swelling but that resolved. Has been icing and heating. No numbness or tingling. He has a constant throb of pain. Wants to come up with a plan    06/13/2023 Patient states that he is feeling was able to run marathon. He has a list of questions   07/05/2023 Patient states that he thinks he has a pinched nerve on the top of his foot . He has a sharp pain when the blankets catch his toes.    12/06/2023 Patient states flare started 2 weeks ago. More spots on the lower leg now. L side groin issue as well    12/27/2023 Patient states  would like a referral for all the things    02/17/2024 Patient states he is feeling better. End of October 1/2 and then full January    06/08/2024 Patient states he has some achilles tendonitis but has been working on it in PT . Wants to know what best course of action with the groin and achilles   06/13/2024 Patient states   Relevant Historical Information: None pertinent  Additional pertinent review of systems negative.  Current Medications[1]       Electronically signed by:  [facility] 4:16 PM 06/13/24     [  1]  Current Outpatient Medications:    ketoconazole  (NIZORAL ) 2 % cream, Apply 1 Application topically 2 (two) times daily., Disp: 60 g, Rfl: 0   meloxicam  (MOBIC ) 15 MG tablet, Take 1 tablet (15 mg total) by mouth daily., Disp: 30 tablet, Rfl: 0   meloxicam  (MOBIC ) 15 MG tablet, Take 1 tablet daily for 2 weeks.  If still in pain after 2 weeks, take 1 tablet daily for an additional 1 week., Disp: 30 tablet, Rfl: 0   Multiple Vitamin (MULTIVITAMIN WITH MINERALS) TABS, Take 1 tablet by mouth daily., Disp: , Rfl:    naproxen sodium (ALEVE) 220 MG  tablet, Take 220 mg by mouth., Disp: , Rfl:   "

## 2024-06-13 ENCOUNTER — Ambulatory Visit: Payer: Self-pay | Admitting: Sports Medicine

## 2024-06-13 ENCOUNTER — Other Ambulatory Visit: Payer: Self-pay

## 2024-06-13 DIAGNOSIS — S76812D Strain of other specified muscles, fascia and tendons at thigh level, left thigh, subsequent encounter: Secondary | ICD-10-CM

## 2024-07-09 ENCOUNTER — Ambulatory Visit: Admitting: Sports Medicine

## 2024-07-25 ENCOUNTER — Ambulatory Visit: Admitting: Sports Medicine
